# Patient Record
Sex: Female | Born: 1945 | Race: White | Hispanic: No | Smoking: Never smoker
Health system: Southern US, Community
[De-identification: ages and names within clinical notes are randomized; demographics above are authoritative.]

## PROBLEM LIST (undated history)

## (undated) DIAGNOSIS — M1990 Unspecified osteoarthritis, unspecified site: Secondary | ICD-10-CM

## (undated) DIAGNOSIS — E785 Hyperlipidemia, unspecified: Secondary | ICD-10-CM

## (undated) DIAGNOSIS — F329 Major depressive disorder, single episode, unspecified: Secondary | ICD-10-CM

## (undated) DIAGNOSIS — F32A Depression, unspecified: Secondary | ICD-10-CM

## (undated) DIAGNOSIS — D649 Anemia, unspecified: Secondary | ICD-10-CM

## (undated) DIAGNOSIS — E119 Type 2 diabetes mellitus without complications: Secondary | ICD-10-CM

## (undated) DIAGNOSIS — C50919 Malignant neoplasm of unspecified site of unspecified female breast: Secondary | ICD-10-CM

## (undated) DIAGNOSIS — E559 Vitamin D deficiency, unspecified: Secondary | ICD-10-CM

## (undated) DIAGNOSIS — M810 Age-related osteoporosis without current pathological fracture: Secondary | ICD-10-CM

## (undated) DIAGNOSIS — C801 Malignant (primary) neoplasm, unspecified: Secondary | ICD-10-CM

## (undated) DIAGNOSIS — I10 Essential (primary) hypertension: Secondary | ICD-10-CM

## (undated) DIAGNOSIS — R42 Dizziness and giddiness: Secondary | ICD-10-CM

## (undated) HISTORY — PX: COLONOSCOPY: SHX174

## (undated) HISTORY — PX: BREAST SURGERY: SHX581

## (undated) HISTORY — PX: ESOPHAGOGASTRODUODENOSCOPY: SHX1529

---

## 1997-01-28 DIAGNOSIS — C801 Malignant (primary) neoplasm, unspecified: Secondary | ICD-10-CM

## 1997-01-28 HISTORY — PX: MASTECTOMY: SHX3

## 1997-01-28 HISTORY — DX: Malignant (primary) neoplasm, unspecified: C80.1

## 2004-01-10 ENCOUNTER — Ambulatory Visit: Payer: Self-pay | Admitting: Unknown Physician Specialty

## 2004-01-29 HISTORY — PX: BREAST BIOPSY: SHX20

## 2004-07-26 ENCOUNTER — Ambulatory Visit: Payer: Self-pay | Admitting: Family Medicine

## 2004-08-07 ENCOUNTER — Ambulatory Visit: Payer: Self-pay | Admitting: Family Medicine

## 2005-03-04 ENCOUNTER — Ambulatory Visit: Payer: Self-pay | Admitting: General Surgery

## 2006-03-31 ENCOUNTER — Ambulatory Visit: Payer: Self-pay | Admitting: General Surgery

## 2007-04-03 ENCOUNTER — Ambulatory Visit: Payer: Self-pay | Admitting: Family Medicine

## 2008-04-11 ENCOUNTER — Ambulatory Visit: Payer: Self-pay | Admitting: Family Medicine

## 2009-03-24 ENCOUNTER — Ambulatory Visit: Payer: Self-pay | Admitting: Unknown Physician Specialty

## 2009-06-19 ENCOUNTER — Ambulatory Visit: Payer: Self-pay | Admitting: Family Medicine

## 2010-06-26 ENCOUNTER — Ambulatory Visit: Payer: Self-pay | Admitting: Family Medicine

## 2011-07-03 ENCOUNTER — Ambulatory Visit: Payer: Self-pay | Admitting: Family Medicine

## 2011-07-03 IMAGING — MG MM MAMMO DIAGNOSTIC UNILATERAL*L*
1 series · 3 of 3 positions shown · non-contrast
Comparison: none

REASON FOR EXAM: hx brst ca r mast
COMMENTS:

PROCEDURE:     MAM - MAM DGTL UNI MAM LT BREAST W/CAD  - July 03, 2011  [DATE]
RESULT:     COMPARISON:  06/26/2010, 06/19/2009, 07/02/2001
TECHNIQUE: Digital unilateral left mammograms were obtained. FDA approved
computer-aided detection (CAD) for mammography was utilized for this study.

[L CC · left · 3 of 3 slices shown]
[im 1/3]
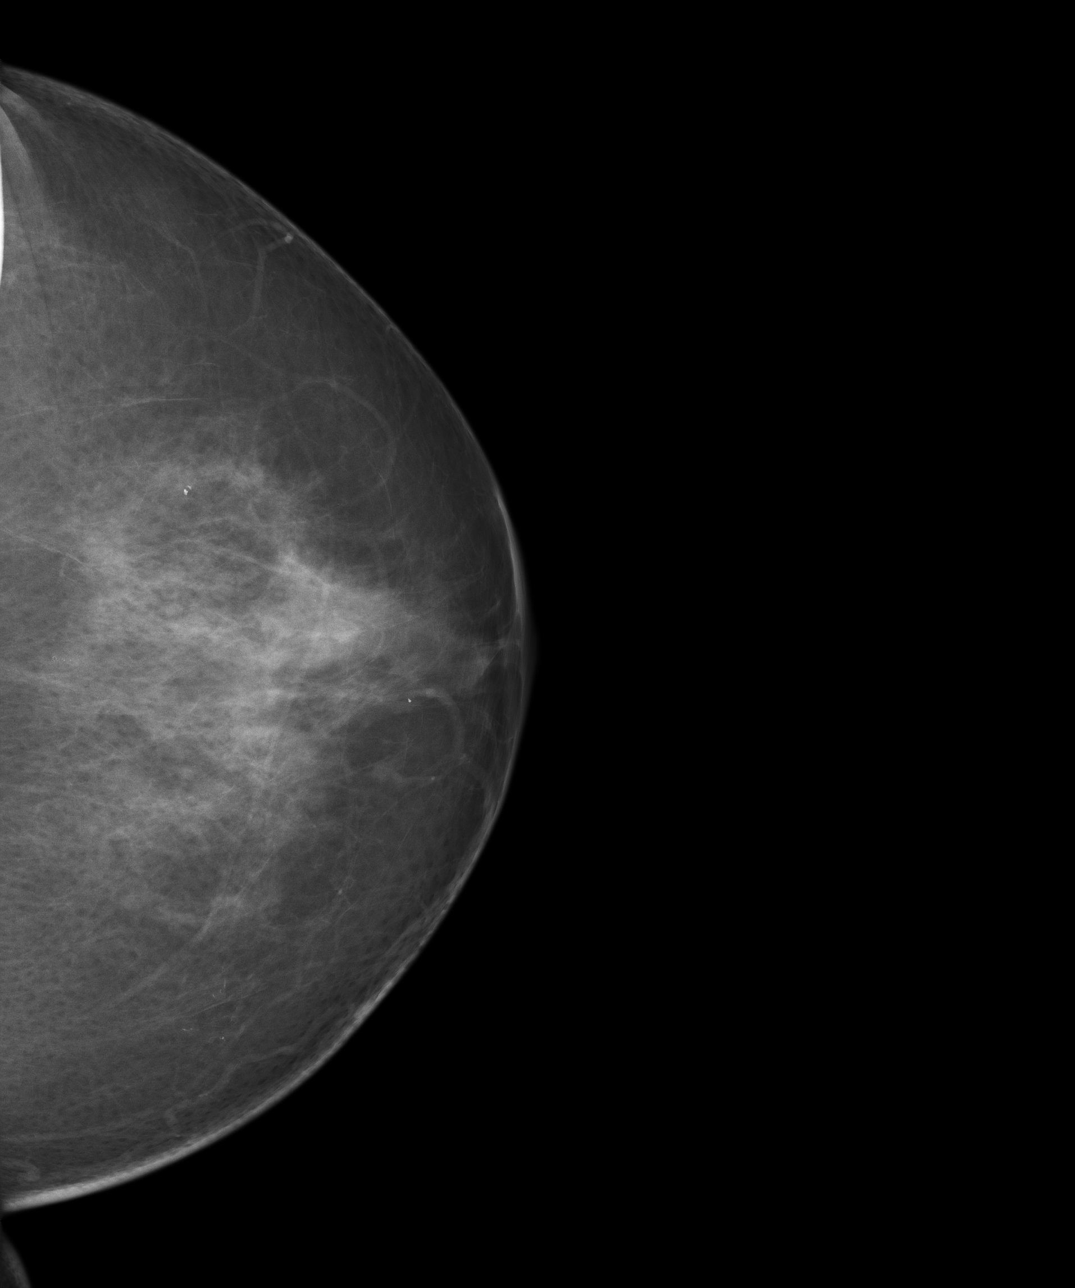
[im 2/3]
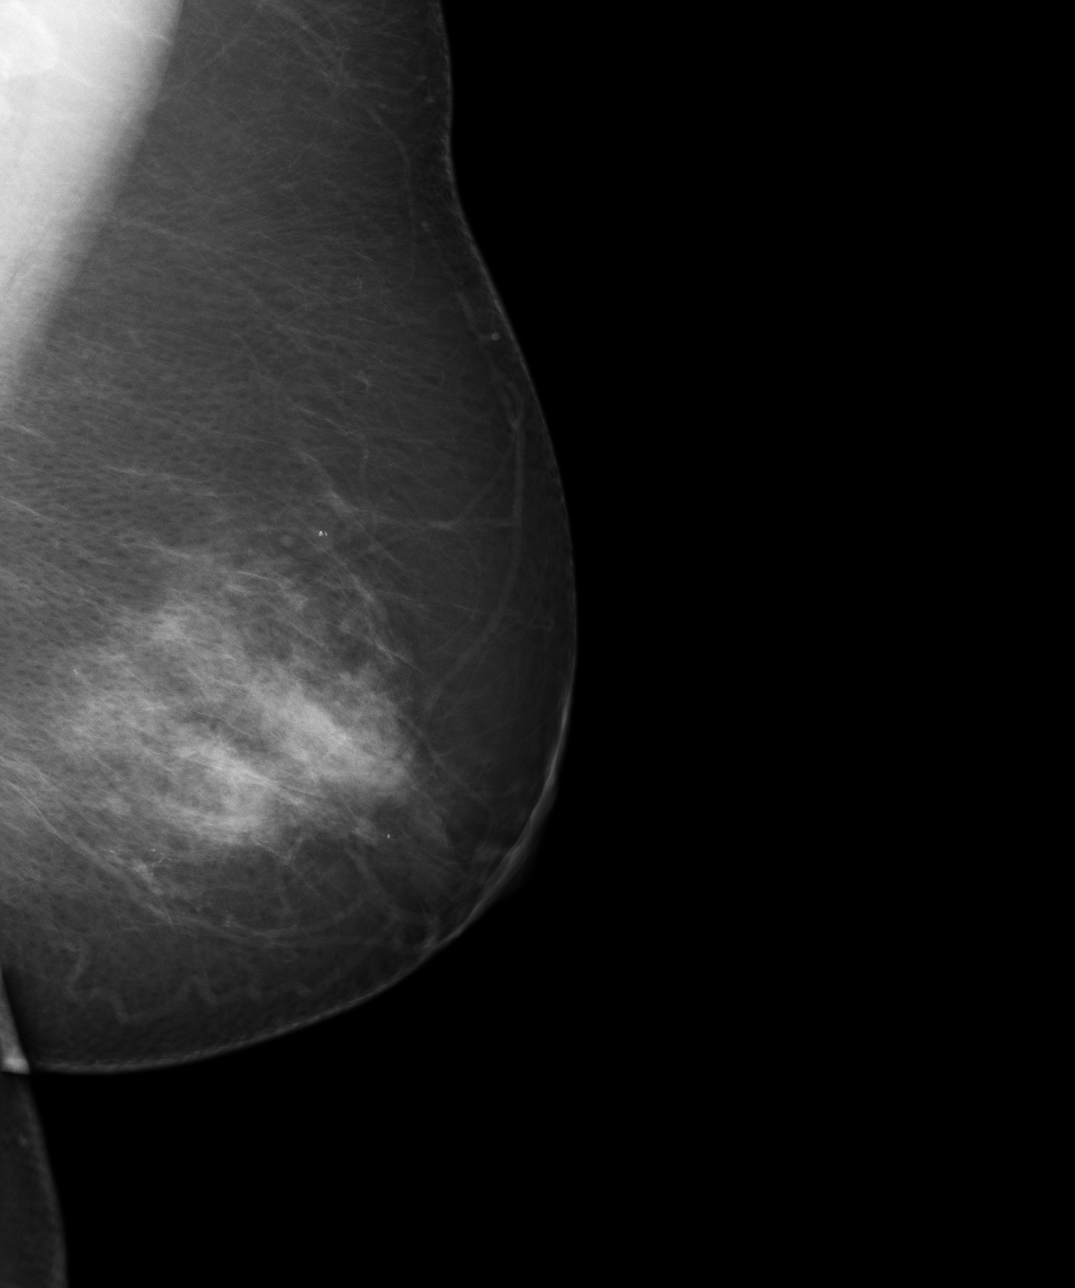
[im 3/3]
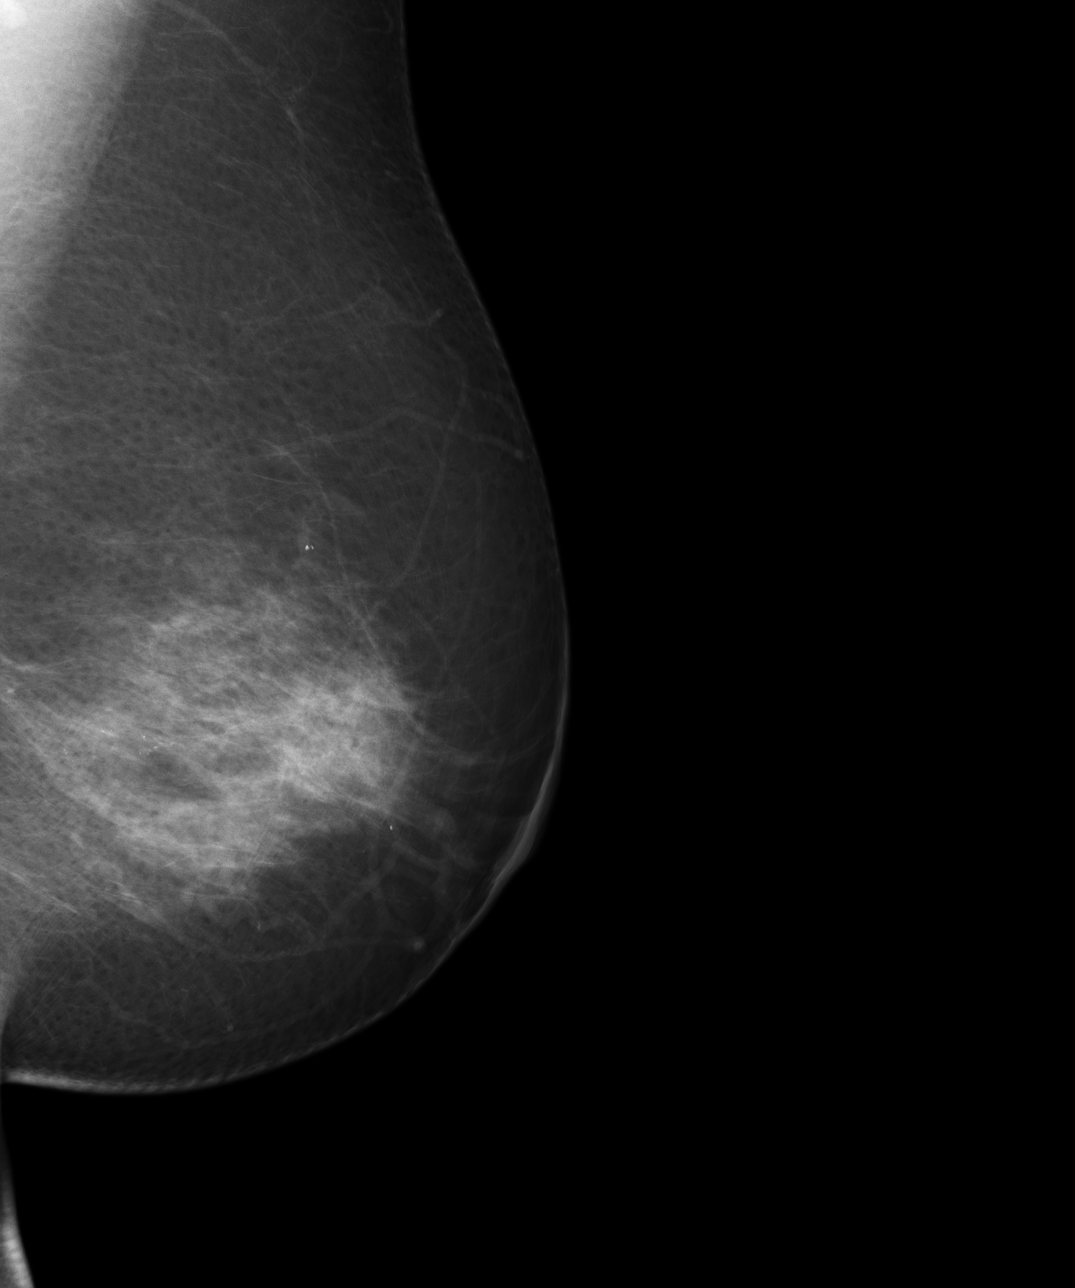

[3 of 3 positions shown; findings below may reference images not displayed]

FINDING: The left breast demonstrates a scattered fibroglandular density. There is no
dominant mass, architectural distortion or clusters of suspicious
microcalcifications.
IMPRESSION: 1.     Stable left breast mammogram.
2.     Annual mammographic follow up recommended.
3.     BI-RADS:  Category 2- Benign.

A negative mammogram report does not preclude biopsy or other evaluation of
a clinically palpable or otherwise suspicious mass or lesion. Breast cancer
may not be detected by mammography in up to 10% of cases.

[REDACTED]

## 2011-11-15 ENCOUNTER — Ambulatory Visit: Payer: Self-pay | Admitting: Internal Medicine

## 2011-11-15 LAB — CBC CANCER CENTER
Basophil %: 0.9 %
Eosinophil %: 3.3 %
HCT: 37.9 % (ref 35.0–47.0)
HGB: 12.3 g/dL (ref 12.0–16.0)
MCH: 30.9 pg (ref 26.0–34.0)
MCHC: 32.4 g/dL (ref 32.0–36.0)
Monocyte #: 0.5 x10 3/mm (ref 0.2–0.9)
Neutrophil %: 68.7 %
RBC: 3.98 10*6/uL (ref 3.80–5.20)

## 2011-11-15 LAB — FERRITIN: Ferritin (ARMC): 65 ng/mL (ref 8–388)

## 2011-11-15 LAB — IRON AND TIBC
Iron Bind.Cap.(Total): 396 ug/dL (ref 250–450)
Iron Saturation: 19 %
Iron: 76 ug/dL (ref 50–170)

## 2011-11-15 LAB — RETICULOCYTES: Absolute Retic Count: 0.0527 10*6/uL (ref 0.023–0.096)

## 2011-11-28 LAB — CANCER CTR PLATELET CT: Platelet: 122 x10 3/mm — ABNORMAL LOW (ref 150–440)

## 2011-11-29 ENCOUNTER — Ambulatory Visit: Payer: Self-pay | Admitting: Internal Medicine

## 2011-12-02 ENCOUNTER — Ambulatory Visit: Payer: Self-pay | Admitting: Internal Medicine

## 2011-12-02 IMAGING — US ABDOMEN ULTRASOUND LIMITED
1 series · 14 of 25 positions shown · non-contrast
Comparison: none

REASON FOR EXAM: Thrombocytopenia
COMMENTS:

[Series 1: abdomen ultrasound limited · 0.31mm/px · 14 of 87 slices shown]
[im 1/87]
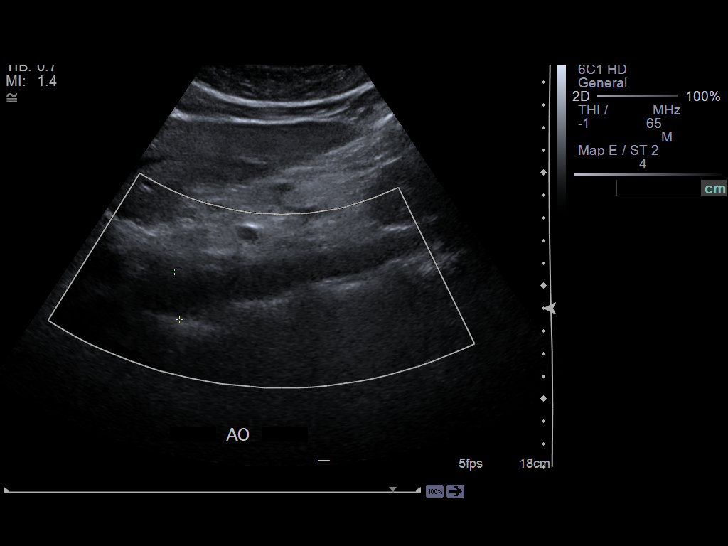
[im 8/87]
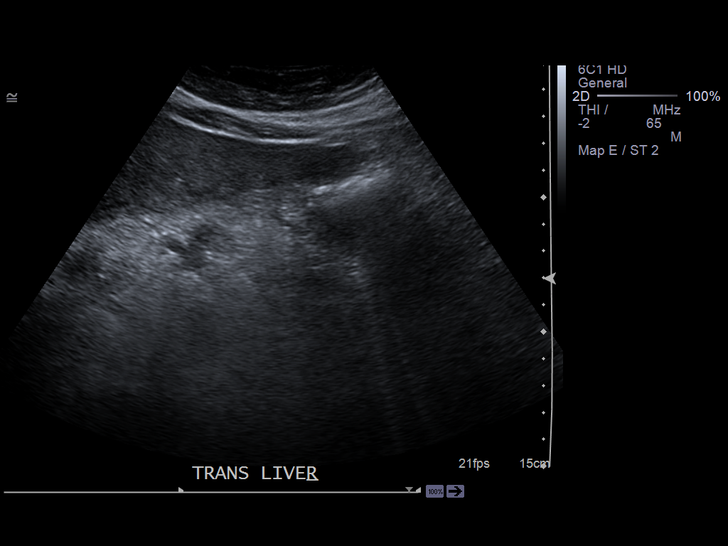
[im 15/87]
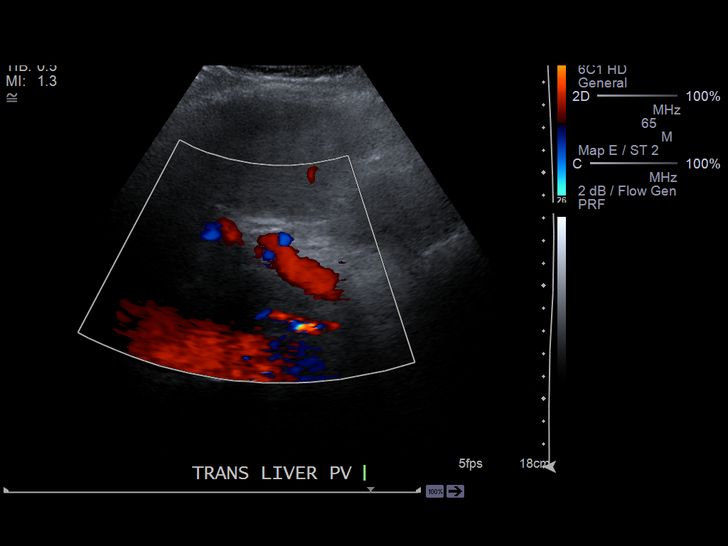
[im 22/87]
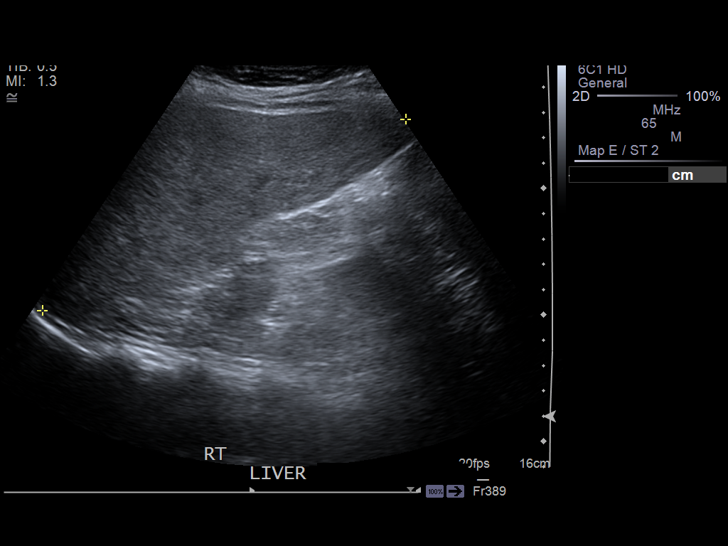
[im 29/87]
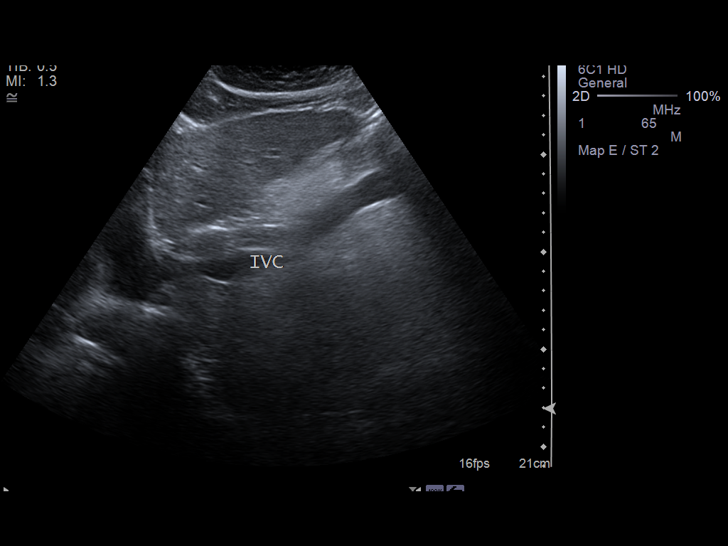
[im 33/87]
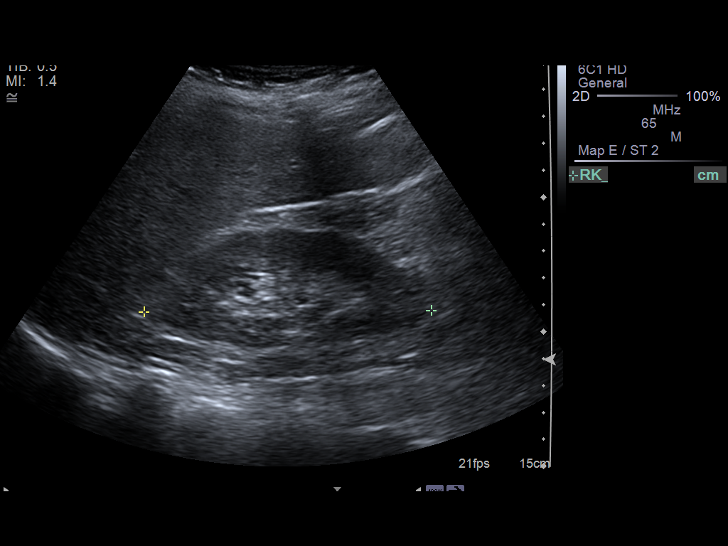
[im 40/87]
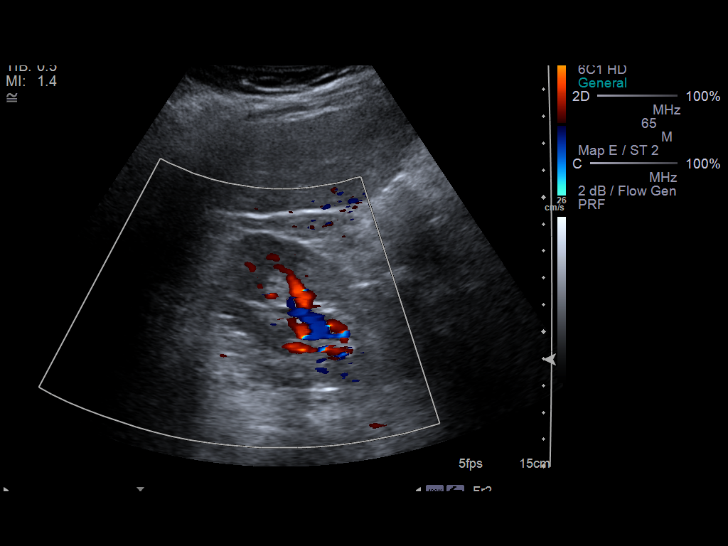
[im 47/87]
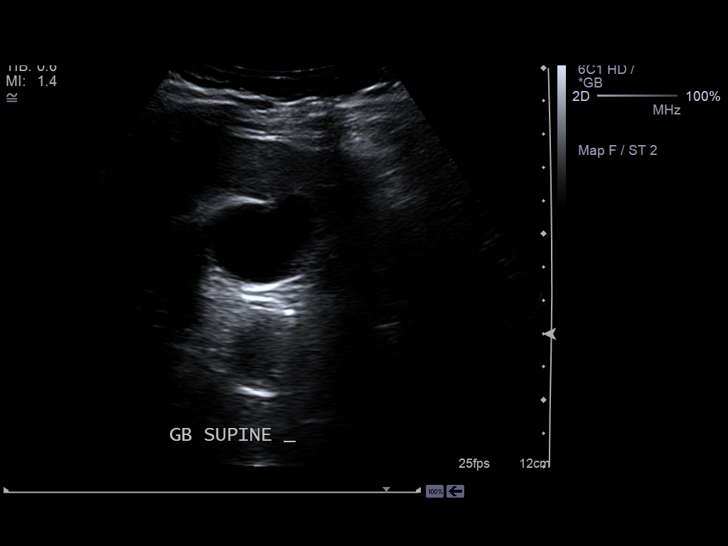
[im 54/87]
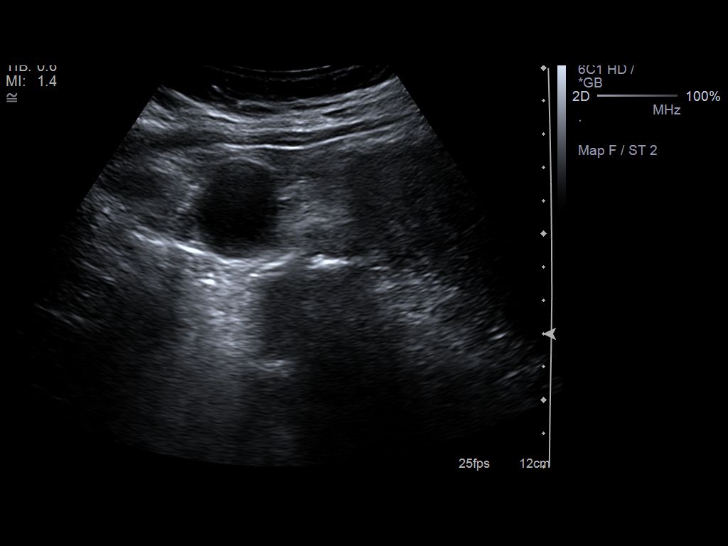
[im 58/87]
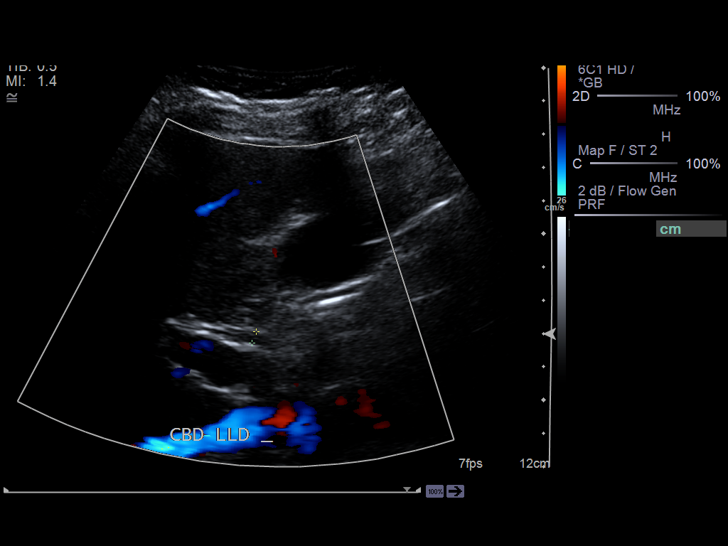
[im 65/87]
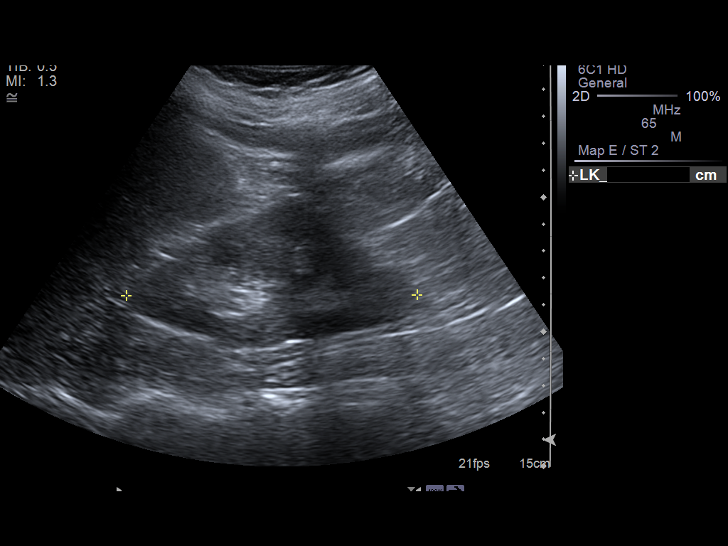
[im 72/87]
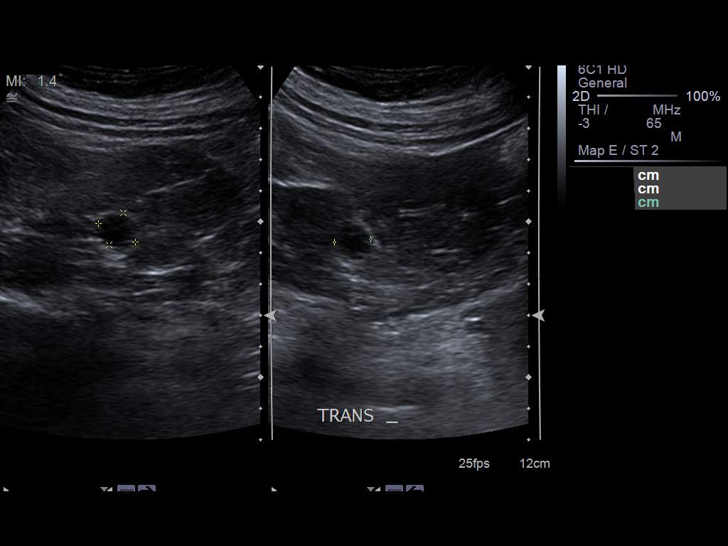
[im 79/87]
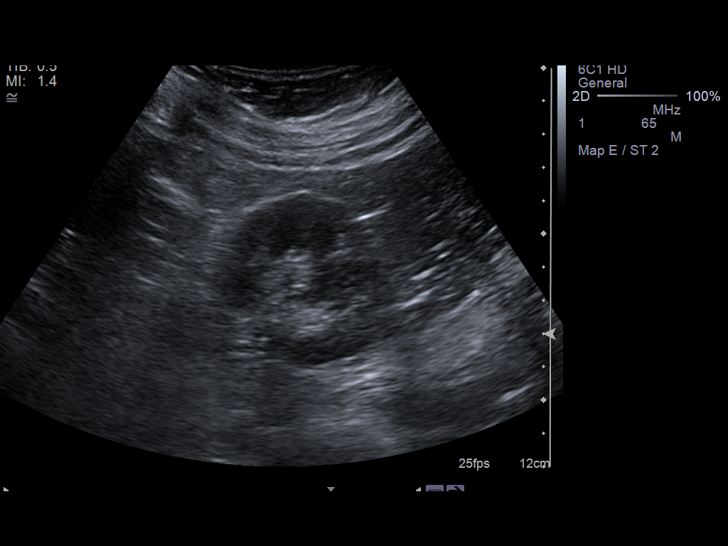
[im 87/87]
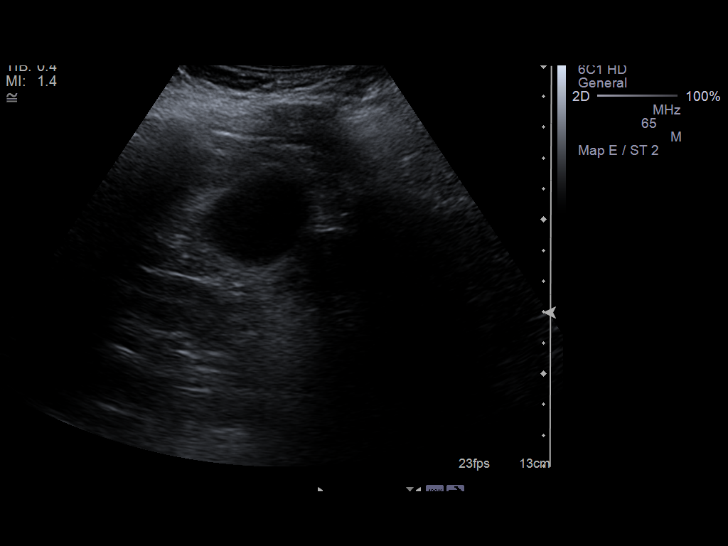

[14 of 25 positions shown; findings below may reference images not displayed]

PROCEDURE:     NAE - NAE ABDOMEN UPPER GENERAL  - December 02, 2011  [DATE]

RESULT:

The liver demonstrates a homogeneous echotexture. Hepatopetal flow is
identified within the portal vein. The aorta and IVC are unremarkable.
Limited evaluation of the pancreas is unremarkable. The spleen demonstrates
a homogeneous echotexture and measures 13.73 cm in longitudinal dimensions.

The right kidney measures 10.67 x 4.67 x 4.96 cm and the left 10.81 x 4.47 x
4.57 cm. There is appropriate corticomedullary differentiation without
evidence of hydronephrosis, solid masses nor calculi. A small cyst is
identified within the left kidney measuring 1.33 x 1.12 x 1.17 cm.
IMPRESSION: 1. Left renal cyst.
2. Spleen upper limits of normal.
3. No further focal or acute abnormalities.

## 2011-12-05 LAB — OCCULT BLOOD X 1 CARD TO LAB, STOOL
Occult Blood, Feces: NEGATIVE
Occult Blood, Feces: NEGATIVE

## 2011-12-05 LAB — IRON AND TIBC
Iron Bind.Cap.(Total): 329 ug/dL (ref 250–450)
Iron: 86 ug/dL (ref 50–170)

## 2011-12-29 ENCOUNTER — Ambulatory Visit: Payer: Self-pay | Admitting: Internal Medicine

## 2012-01-02 LAB — CBC CANCER CENTER
Basophil %: 0.8 %
Eosinophil %: 2.5 %
HCT: 33.3 % — ABNORMAL LOW (ref 35.0–47.0)
HGB: 11.5 g/dL — ABNORMAL LOW (ref 12.0–16.0)
Lymphocyte %: 18.2 %
MCH: 31.8 pg (ref 26.0–34.0)
Neutrophil %: 70 %
Platelet: 106 x10 3/mm — ABNORMAL LOW (ref 150–440)
RBC: 3.61 10*6/uL — ABNORMAL LOW (ref 3.80–5.20)
WBC: 5.5 x10 3/mm (ref 3.6–11.0)

## 2012-01-29 ENCOUNTER — Ambulatory Visit: Payer: Self-pay | Admitting: Internal Medicine

## 2012-02-13 LAB — OCCULT BLOOD X 1 CARD TO LAB, STOOL
Occult Blood, Feces: NEGATIVE
Occult Blood, Feces: NEGATIVE

## 2012-02-13 LAB — CBC CANCER CENTER
Basophil #: 0.1 x10 3/mm (ref 0.0–0.1)
HCT: 36 % (ref 35.0–47.0)
HGB: 12 g/dL (ref 12.0–16.0)
Lymphocyte %: 17.9 %
MCH: 31 pg (ref 26.0–34.0)
MCHC: 33.4 g/dL (ref 32.0–36.0)
MCV: 93 fL (ref 80–100)
Monocyte %: 10 %
Neutrophil #: 3.9 x10 3/mm (ref 1.4–6.5)
Neutrophil %: 68.6 %
Platelet: 116 x10 3/mm — ABNORMAL LOW (ref 150–440)
RBC: 3.89 10*6/uL (ref 3.80–5.20)

## 2012-02-29 ENCOUNTER — Ambulatory Visit: Payer: Self-pay | Admitting: Internal Medicine

## 2012-04-28 ENCOUNTER — Ambulatory Visit: Payer: Self-pay | Admitting: Internal Medicine

## 2012-05-14 LAB — CBC CANCER CENTER
Basophil #: 0.1 x10 3/mm (ref 0.0–0.1)
Basophil %: 1.4 %
Eosinophil #: 0.2 x10 3/mm (ref 0.0–0.7)
HGB: 12.3 g/dL (ref 12.0–16.0)
Lymphocyte %: 16.9 %
MCH: 31.3 pg (ref 26.0–34.0)
MCV: 94 fL (ref 80–100)
Monocyte #: 0.4 x10 3/mm (ref 0.2–0.9)
Monocyte %: 7.7 %
Neutrophil %: 69.5 %
Platelet: 107 x10 3/mm — ABNORMAL LOW (ref 150–440)
RBC: 3.94 10*6/uL (ref 3.80–5.20)
WBC: 5.5 x10 3/mm (ref 3.6–11.0)

## 2012-05-14 LAB — IRON AND TIBC: Iron: 87 ug/dL (ref 50–170)

## 2012-05-14 LAB — FERRITIN: Ferritin (ARMC): 69 ng/mL (ref 8–388)

## 2012-05-14 LAB — OCCULT BLOOD X 1 CARD TO LAB, STOOL
Occult Blood, Feces: NEGATIVE
Occult Blood, Feces: NEGATIVE

## 2012-05-28 ENCOUNTER — Ambulatory Visit: Payer: Self-pay | Admitting: Internal Medicine

## 2012-07-07 ENCOUNTER — Ambulatory Visit: Payer: Self-pay | Admitting: Family Medicine

## 2012-07-07 IMAGING — MG MM MAMMO DIAGNOSTIC UNILATERAL*L*
3 series · 5 of 5 positions shown · non-contrast
Comparison: none

REASON FOR EXAM: HX BRST CA RT MAST
COMMENTS:

[L CC · left · 3 of 3 slices shown]
[im 1/3]
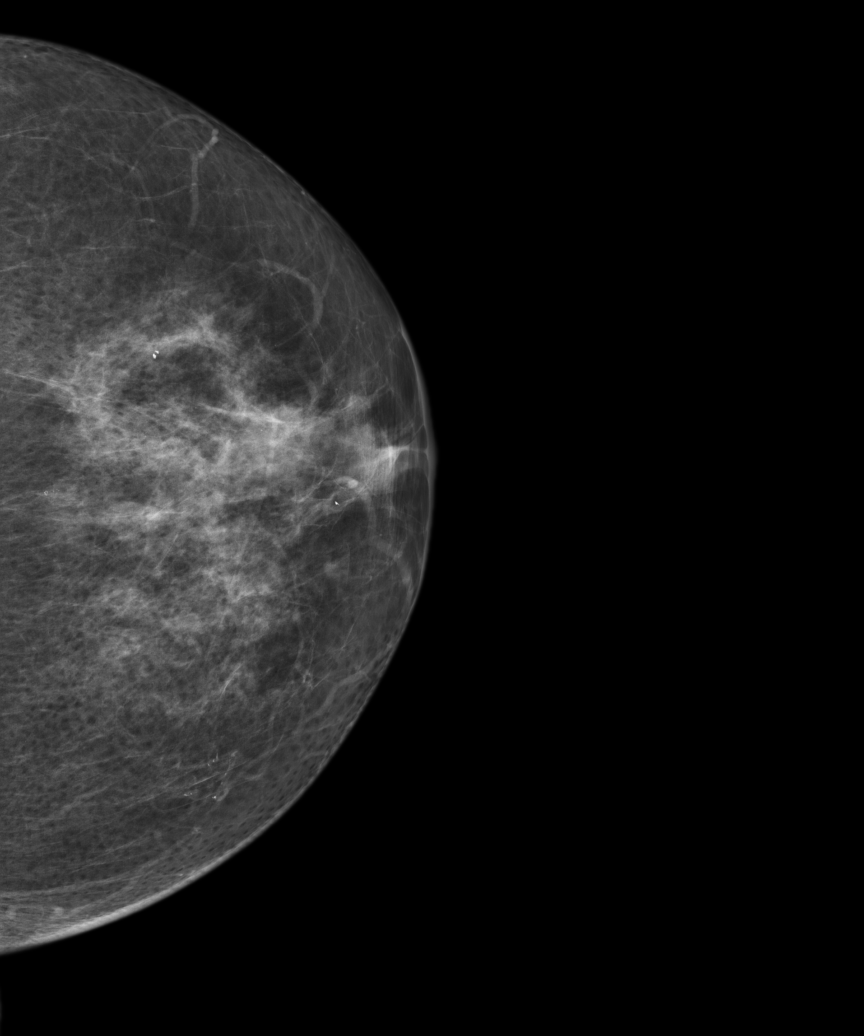
[im 2/3]
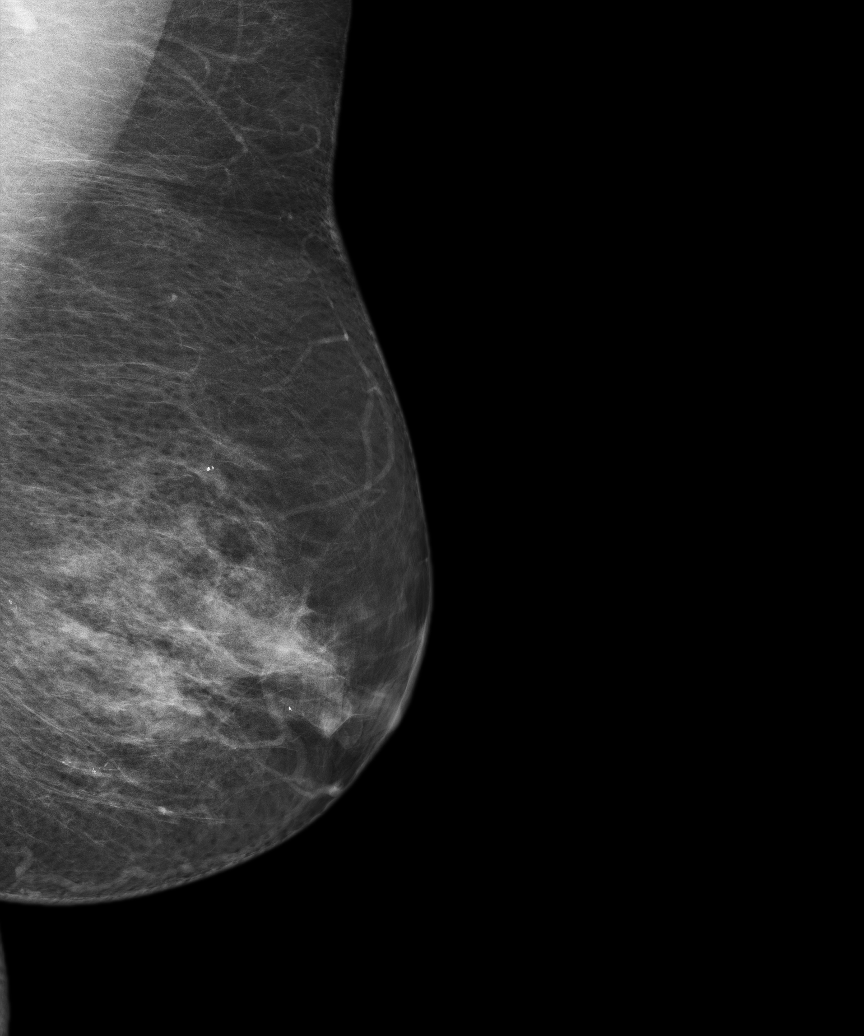
[im 3/3]
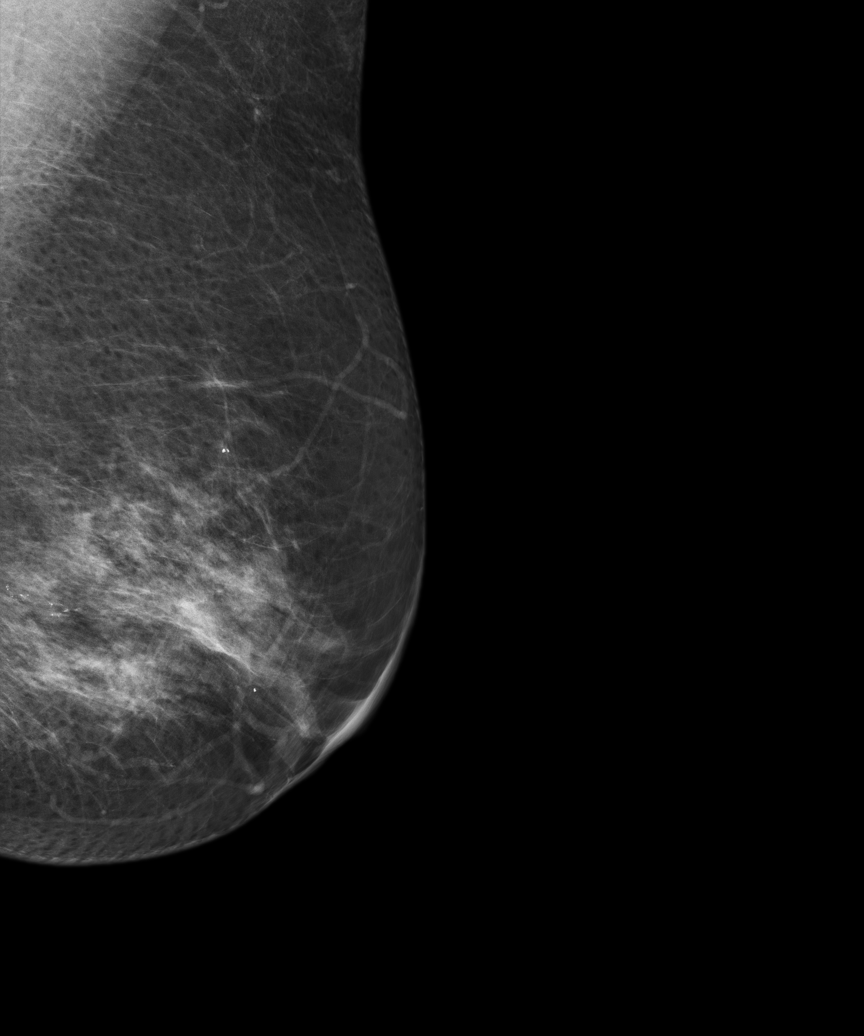

[L MLO]
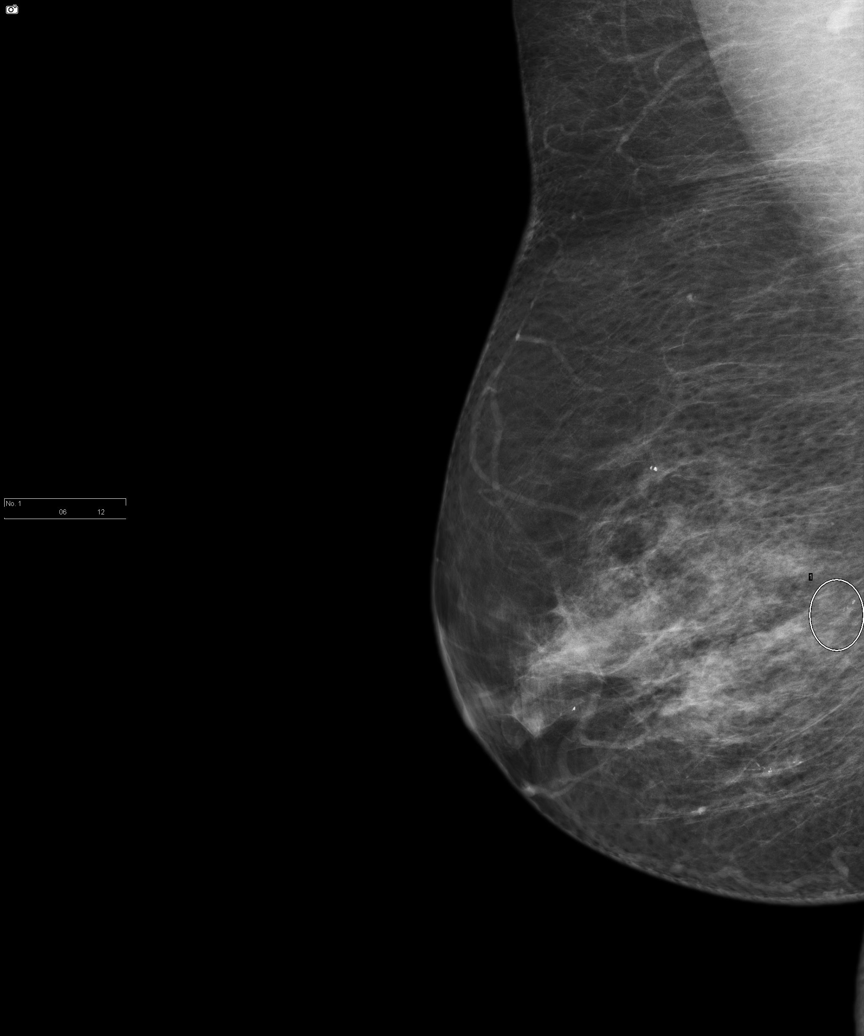

[L ML]
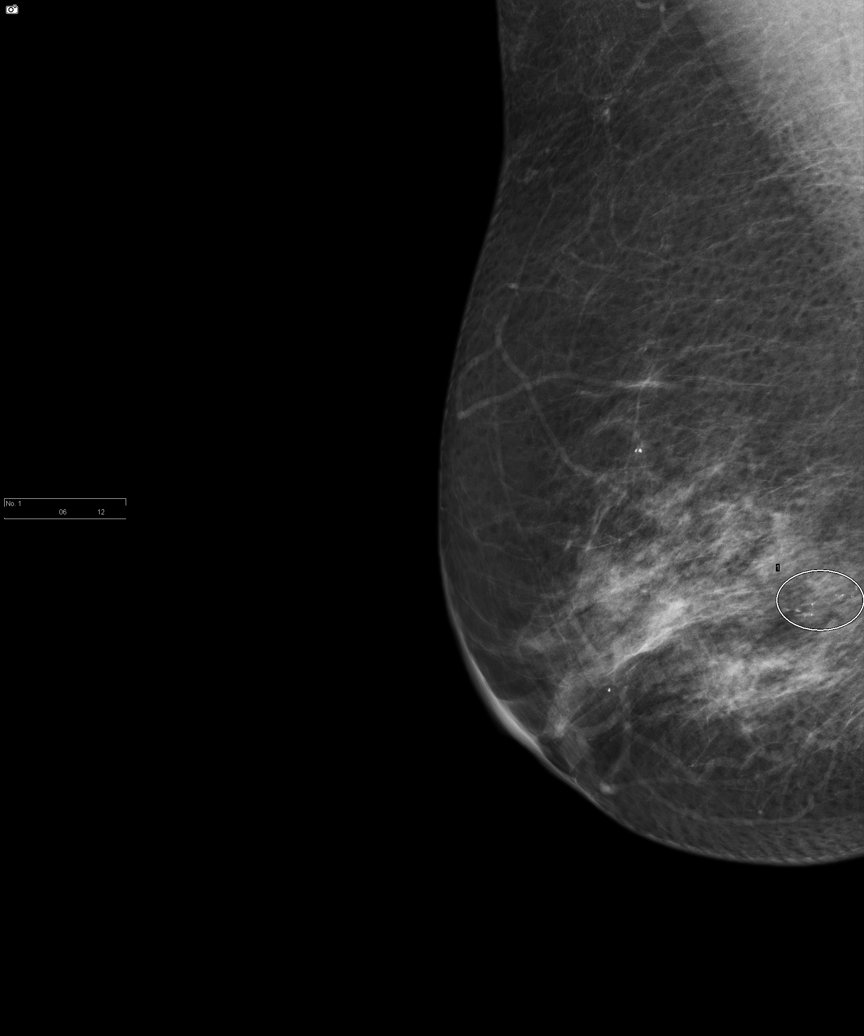

[5 of 5 positions shown; findings below may reference images not displayed]

PROCEDURE:     MAM - MAM DGTL UNI MAM LT BREAST W/CAD  - July 07, 2012 [DATE]

RESULT:     The patient has history of previous right mastectomy. Today's
mammogram of the left breast is compared to previous studies July 13, 2011,June 26, 2010, and April 03, 2007.

The left breast exhibits a heterogeneously dense parenchymal pattern with
evidence of some ongoing involution. There are punctate rounded coarse
microcalcifications bilaterally. There is no dominant mass. There are coarse
microcalcifications which have become more conspicuous in the deep
parenchyma of the left breast. These lie just below the equator of the
breast on the true lateral and MLO views. There are not clearly included in
the field-of-view of the CC view today. They may lies centrally. There are
other loosely grouped stable microcalcifications quite medially in the
midportion of the left breast inferiorly which had been seen at least as far
back as [id number].
IMPRESSION: There are indeterminate microcalcifications demonstrated
today on the MLO and true lateral films which are not clearly completely
included on the cc view but may lie centrally on the cc view. Medially and
laterally rotated cc views with spot compression are needed to evaluate this
region more completely.

BI-RADS 0: Additional workup of the left breast with supplemental
mammographic views is recommended.

The area in question has been marked electronically and the images saved.

BREAST COMPOSITION: The breast composition is HETEROGENEOUSLY DENSE
(glandular tissue is 51-75%) This may decrease the sensitivity of
mammography.

A NEGATIVE MAMMOGRAM REPORT DOES NOT PRECLUDE BIOPSY OR OTHER EVALUATION OF
A CLINICALLY PALPABLE OR OTHERWISE SUSPICIOUS MASS OR LESION. BREAST CANCER
MAY NOT BE DETECTED BY MAMMOGRAPHY IN UP TO 10% OF CASES.

[REDACTED]

## 2012-07-08 ENCOUNTER — Ambulatory Visit: Payer: Self-pay | Admitting: Family Medicine

## 2012-07-08 IMAGING — MG MM ADDITIONAL VIEWS AT NO CHARGE
1 series · 7 of 7 positions shown · non-contrast
Comparison: none

REASON FOR EXAM: av lt microcalcifications
COMMENTS:

[L XCCL · left · 7 of 7 slices shown]
[im 1/7]
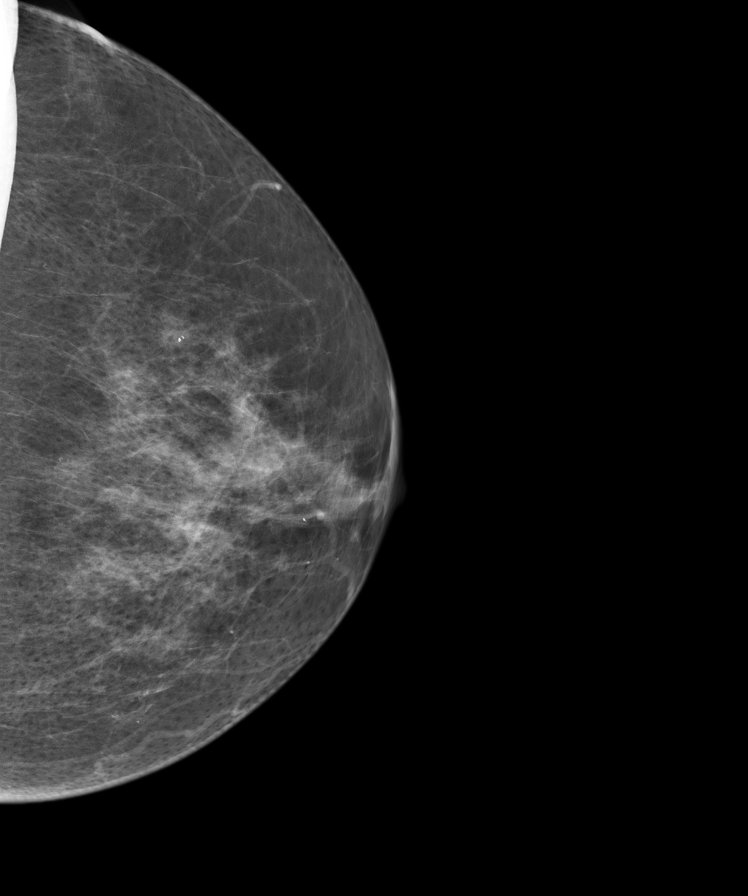
[im 2/7]
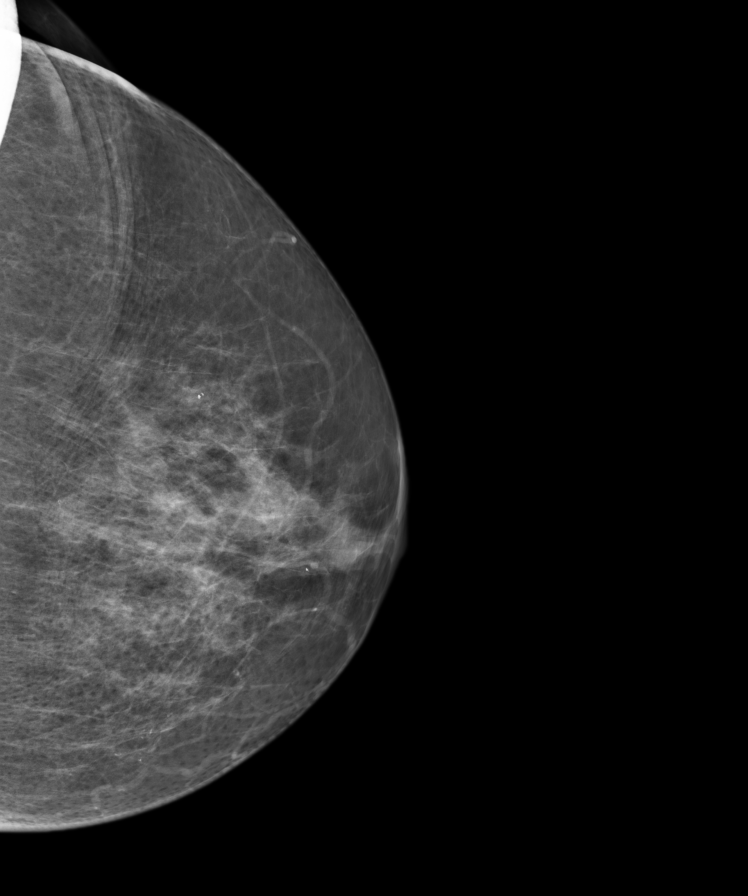
[im 3/7]
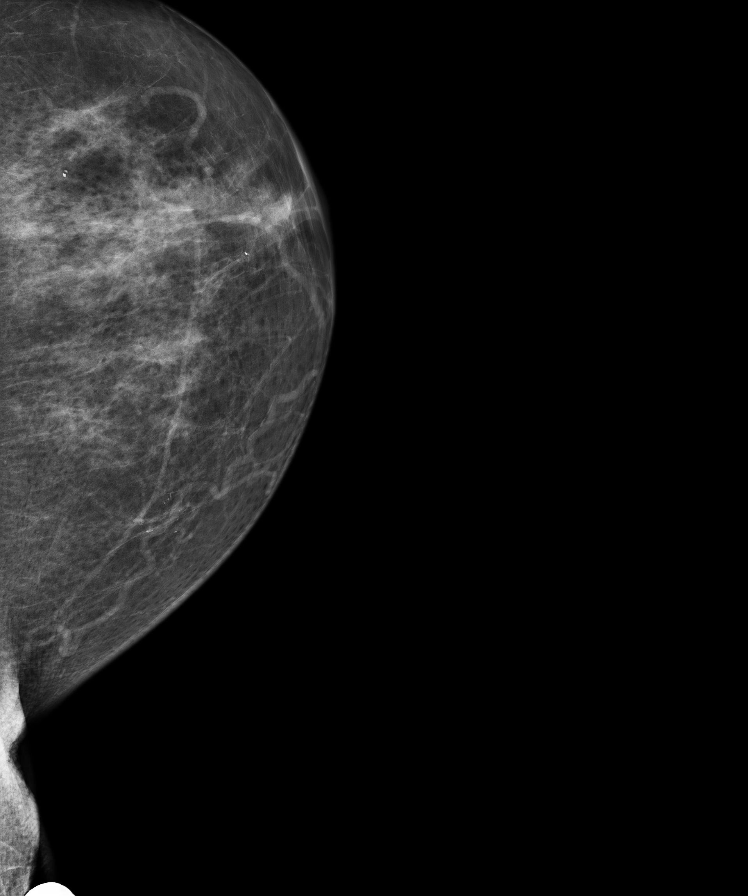
[im 4/7]
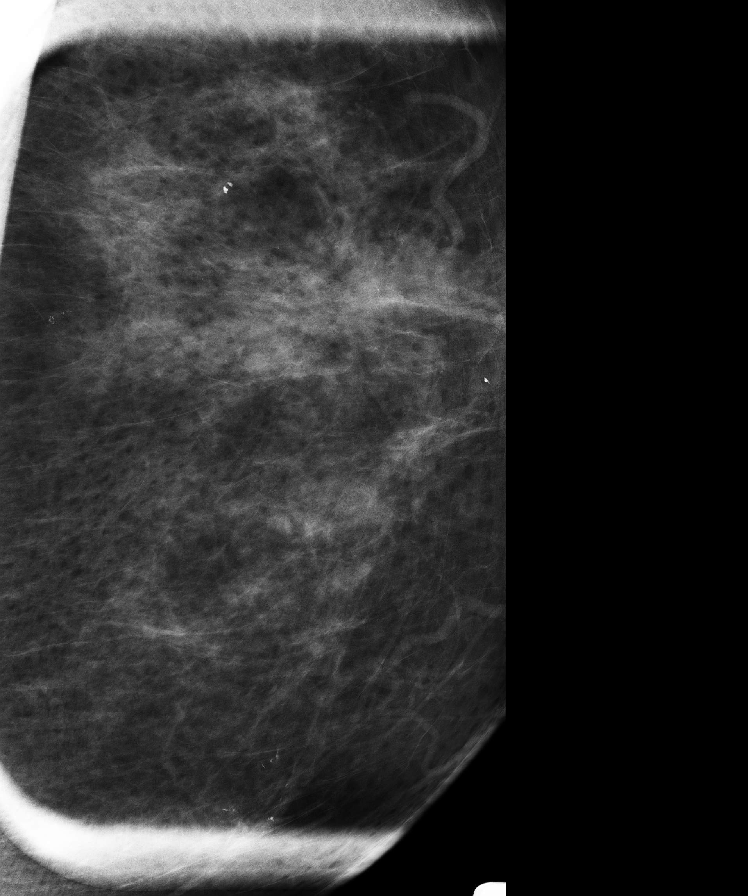
[im 5/7]
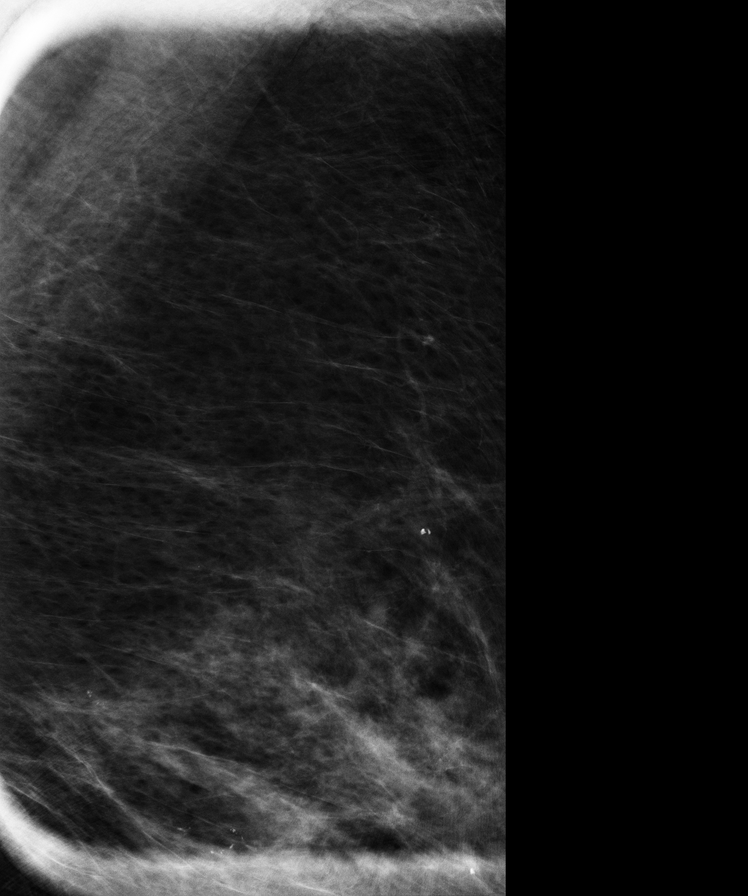
[im 6/7]
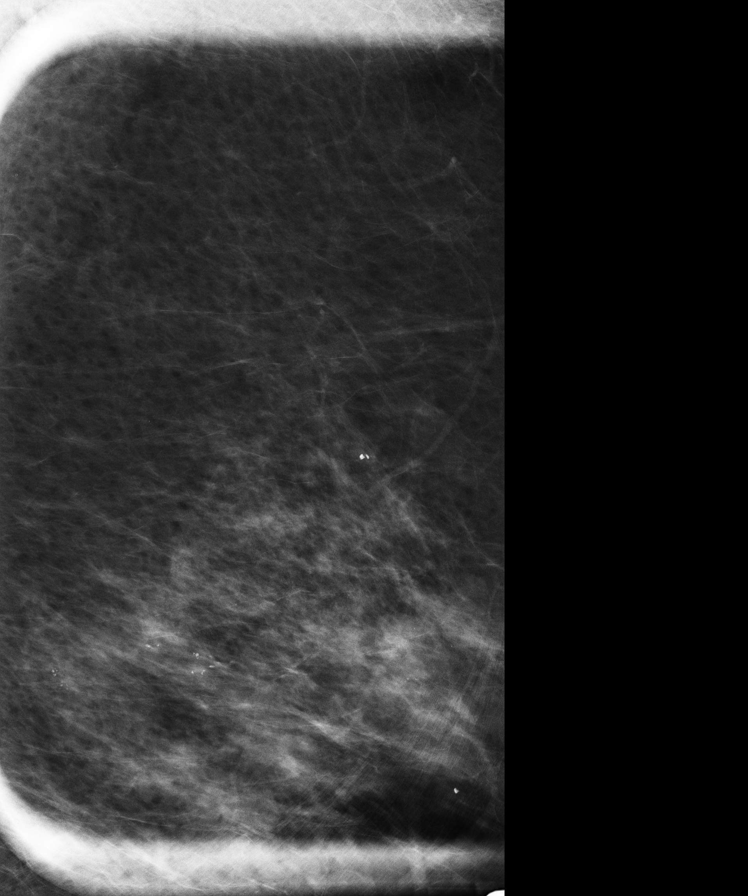
[im 7/7]
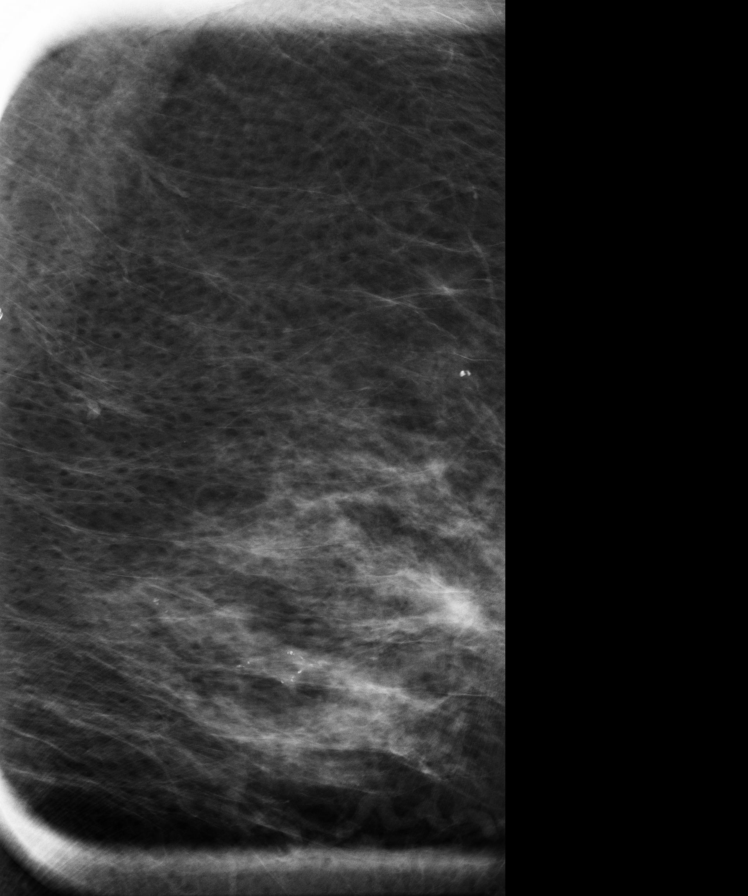

[7 of 7 positions shown; findings below may reference images not displayed]

PROCEDURE:     MAM - MAM DGTL [HOSPITAL] ADD VIEWS LT DIAG  - July 08, 2012 [DATE]

RESULT:     On 07 July, 2012, the patient underwent screening mammography of
the left breast. The patient has history of previous right mastectomy. At
the time of the recent study the patient was noted to have indeterminate
microcalcifications not clearly evident on the cc view but present on the
MLO and true lateral film. The patient was asked to return for supplemental
imaging of the left breast.

On these supplemental CC images there are coarse microcalcifications which
lie medially which correspond to inferiorly positioned microcalcifications
on the MLO views. There is a faint deeper grouping of microcalcifications
lying near the chest wall in the central portion of the left breast at the
junction of the dense fibroglandular tissue fatty tissue on the cc view. The
also lie relatively centrally on the MLO view. These do not appear
suspicious in their morphology.
IMPRESSION: There are 2 groupings of microcalcifications on the left.
One lies infero- medially in the midportion of the breast. The second it is
a smaller cluster that is located in the deep central aspect of the breast
just below the equator. These do not appear new when compared to previous
studies.

BI-RADS 2: Benign findings.

Recommendation: Please continue to encourage yearly mammographic followup.

BREAST COMPOSITION: The breast composition is SCATTERED FIBROGLANDULAR
TISSUE (glandular tissue is 25-50%)

A NEGATIVE MAMMOGRAM REPORT DOES NOT PRECLUDE BIOPSY OR OTHER EVALUATION OF
A CLINICALLY PALPABLE OR OTHERWISE SUSPICIOUS MASS OR LESION. BREAST CA[HOSPITAL]ER
MAY NOT BE DETECTED BY MAMMOGRAPHY IN UP TO 10% OF CASES.

[REDACTED]

## 2012-07-28 ENCOUNTER — Ambulatory Visit: Payer: Self-pay | Admitting: Internal Medicine

## 2012-08-24 LAB — CBC CANCER CENTER
Eosinophil %: 3.3 %
Lymphocyte #: 1 x10 3/mm (ref 1.0–3.6)
MCHC: 34.7 g/dL (ref 32.0–36.0)
Monocyte #: 0.5 x10 3/mm (ref 0.2–0.9)
Monocyte %: 9.2 %
Neutrophil #: 3.6 x10 3/mm (ref 1.4–6.5)
Platelet: 111 x10 3/mm — ABNORMAL LOW (ref 150–440)
RBC: 3.59 10*6/uL — ABNORMAL LOW (ref 3.80–5.20)
RDW: 13.4 % (ref 11.5–14.5)
WBC: 5.3 x10 3/mm (ref 3.6–11.0)

## 2012-08-28 ENCOUNTER — Ambulatory Visit: Payer: Self-pay | Admitting: Internal Medicine

## 2013-02-19 ENCOUNTER — Ambulatory Visit: Payer: Self-pay | Admitting: Internal Medicine

## 2013-02-22 LAB — CBC CANCER CENTER
BASOS PCT: 1.3 %
Basophil #: 0.1 x10 3/mm (ref 0.0–0.1)
Eosinophil #: 0.2 x10 3/mm (ref 0.0–0.7)
Eosinophil %: 2.9 %
HCT: 35.1 % (ref 35.0–47.0)
HGB: 11.6 g/dL — ABNORMAL LOW (ref 12.0–16.0)
Lymphocyte #: 1 x10 3/mm (ref 1.0–3.6)
Lymphocyte %: 18.4 %
MCH: 31.2 pg (ref 26.0–34.0)
MCHC: 32.9 g/dL (ref 32.0–36.0)
MCV: 95 fL (ref 80–100)
MONOS PCT: 9.9 %
Monocyte #: 0.5 x10 3/mm (ref 0.2–0.9)
Neutrophil #: 3.6 x10 3/mm (ref 1.4–6.5)
Neutrophil %: 67.5 %
Platelet: 95 x10 3/mm — ABNORMAL LOW (ref 150–440)
RBC: 3.71 10*6/uL — ABNORMAL LOW (ref 3.80–5.20)
RDW: 13.1 % (ref 11.5–14.5)
WBC: 5.3 x10 3/mm (ref 3.6–11.0)

## 2013-02-28 ENCOUNTER — Ambulatory Visit: Payer: Self-pay | Admitting: Internal Medicine

## 2013-03-29 ENCOUNTER — Ambulatory Visit: Payer: Self-pay | Admitting: Internal Medicine

## 2013-03-29 LAB — CBC CANCER CENTER
BASOS ABS: 0.1 x10 3/mm (ref 0.0–0.1)
Basophil %: 1 %
EOS ABS: 0.1 x10 3/mm (ref 0.0–0.7)
Eosinophil %: 3 %
HCT: 35.1 % (ref 35.0–47.0)
HGB: 11.5 g/dL — AB (ref 12.0–16.0)
Lymphocyte #: 0.8 x10 3/mm — ABNORMAL LOW (ref 1.0–3.6)
Lymphocyte %: 17.1 %
MCH: 31.1 pg (ref 26.0–34.0)
MCHC: 32.9 g/dL (ref 32.0–36.0)
MCV: 95 fL (ref 80–100)
Monocyte #: 0.4 x10 3/mm (ref 0.2–0.9)
Monocyte %: 7.9 %
NEUTROS ABS: 3.5 x10 3/mm (ref 1.4–6.5)
NEUTROS PCT: 71 %
Platelet: 104 x10 3/mm — ABNORMAL LOW (ref 150–440)
RBC: 3.71 10*6/uL — ABNORMAL LOW (ref 3.80–5.20)
RDW: 13.4 % (ref 11.5–14.5)
WBC: 4.9 x10 3/mm (ref 3.6–11.0)

## 2013-04-28 ENCOUNTER — Ambulatory Visit: Payer: Self-pay | Admitting: Internal Medicine

## 2013-06-28 ENCOUNTER — Ambulatory Visit: Payer: Self-pay | Admitting: Internal Medicine

## 2013-06-28 LAB — CBC CANCER CENTER
BASOS PCT: 2.4 %
Basophil #: 0.1 x10 3/mm (ref 0.0–0.1)
Eosinophil #: 0.5 x10 3/mm (ref 0.0–0.7)
Eosinophil %: 8.9 %
HCT: 34.7 % — ABNORMAL LOW (ref 35.0–47.0)
HGB: 11.7 g/dL — AB (ref 12.0–16.0)
LYMPHS PCT: 16.5 %
Lymphocyte #: 1 x10 3/mm (ref 1.0–3.6)
MCH: 32.1 pg (ref 26.0–34.0)
MCHC: 33.7 g/dL (ref 32.0–36.0)
MCV: 95 fL (ref 80–100)
Monocyte #: 0.5 x10 3/mm (ref 0.2–0.9)
Monocyte %: 9 %
NEUTROS ABS: 3.8 x10 3/mm (ref 1.4–6.5)
NEUTROS PCT: 63.2 %
Platelet: 111 x10 3/mm — ABNORMAL LOW (ref 150–440)
RBC: 3.65 10*6/uL — AB (ref 3.80–5.20)
RDW: 13.5 % (ref 11.5–14.5)
WBC: 6 x10 3/mm (ref 3.6–11.0)

## 2013-07-28 ENCOUNTER — Ambulatory Visit: Payer: Self-pay | Admitting: Internal Medicine

## 2013-08-06 ENCOUNTER — Ambulatory Visit: Payer: Self-pay | Admitting: Family Medicine

## 2013-08-06 IMAGING — MG MM MAMMO SCREENING UNILAT*L*
1 series · 3 of 3 positions shown · non-contrast
Comparison: Previous exam(s).

CLINICAL DATA: Screening.

EXAM:
DIGITAL SCREENING UNILATERAL LEFT MAMMOGRAM

[L CC · left · 3 of 3 slices shown]
[im 1/3]
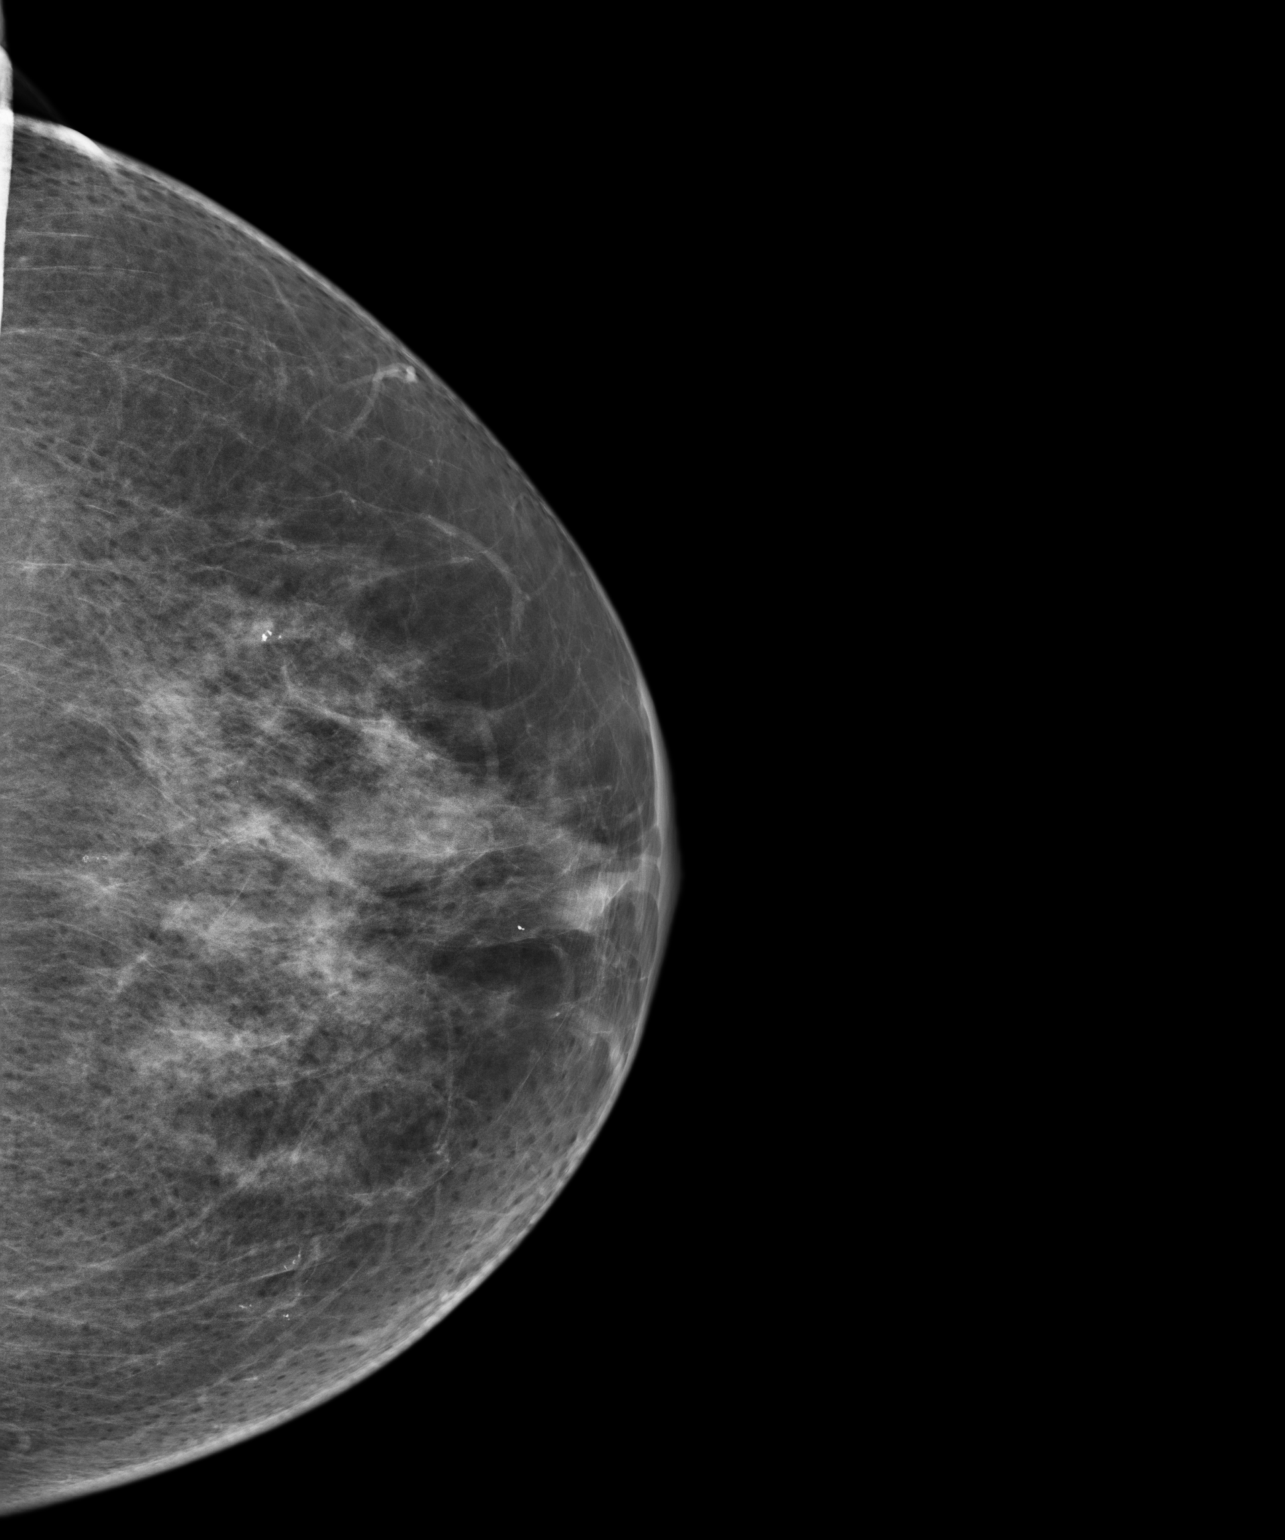
[im 2/3]
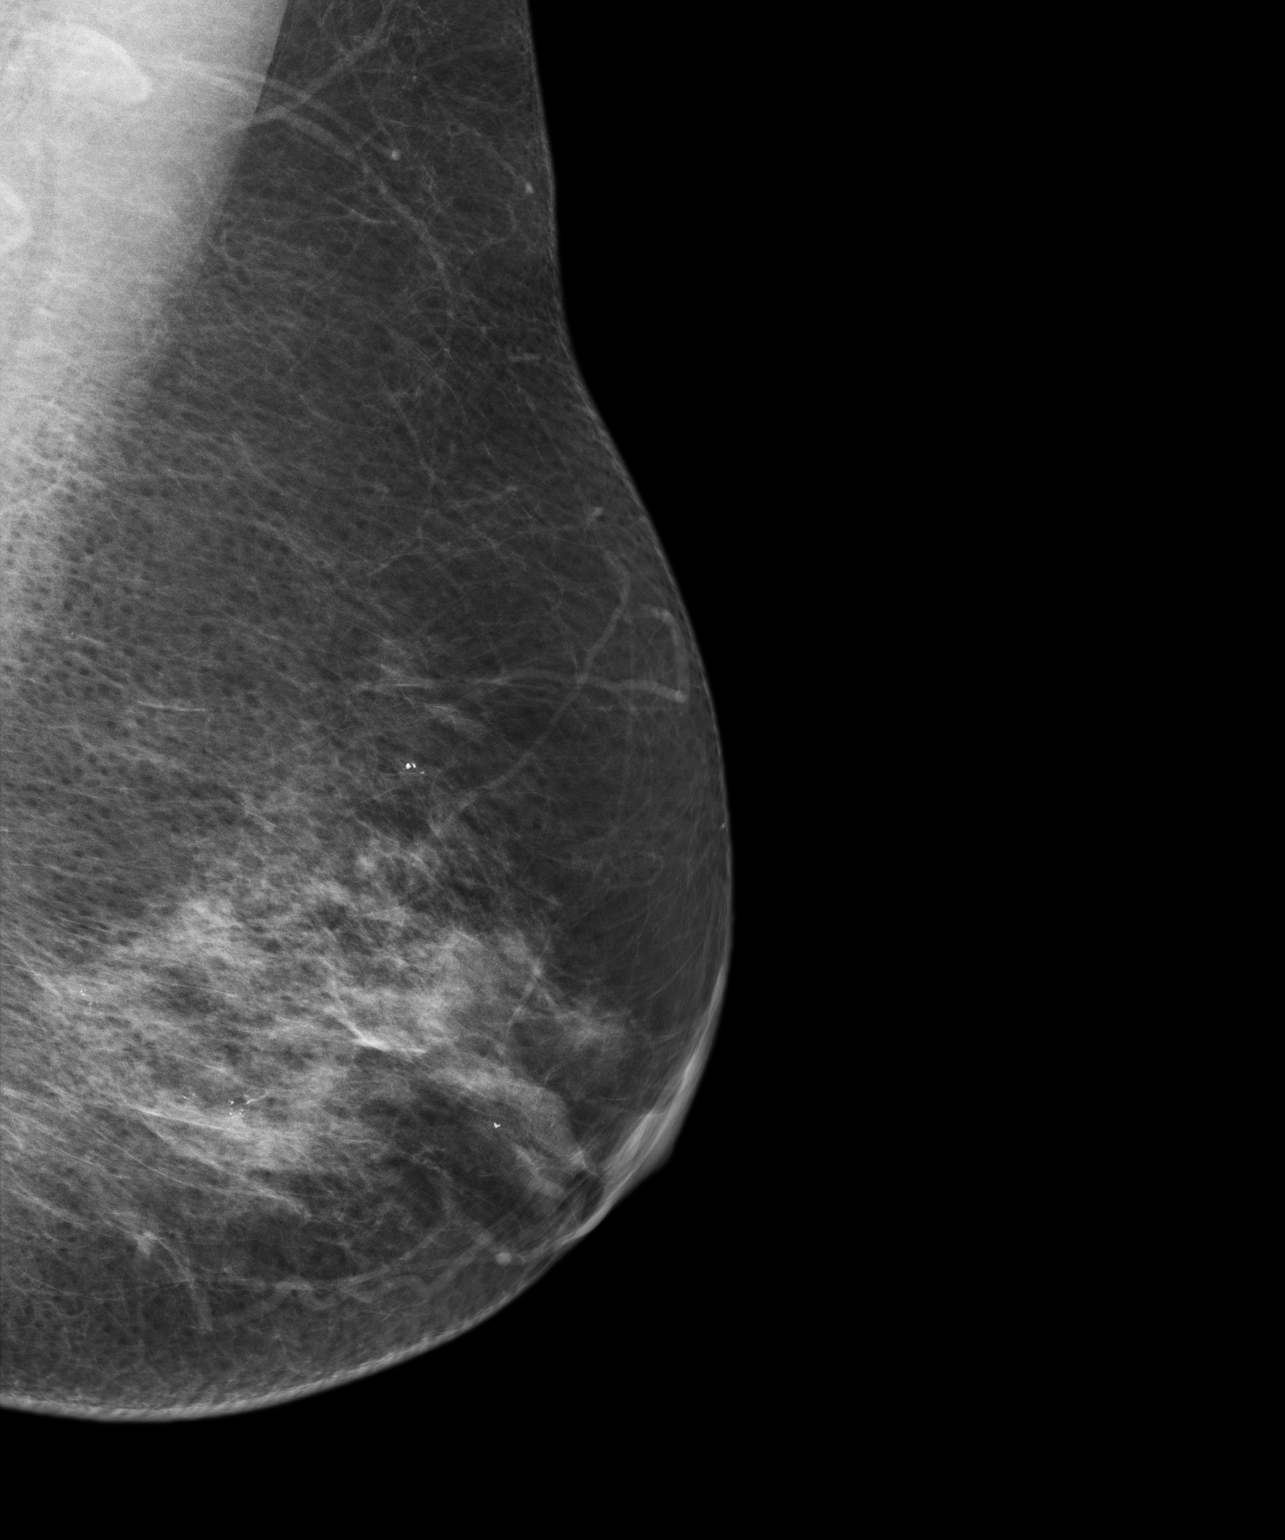
[im 3/3]
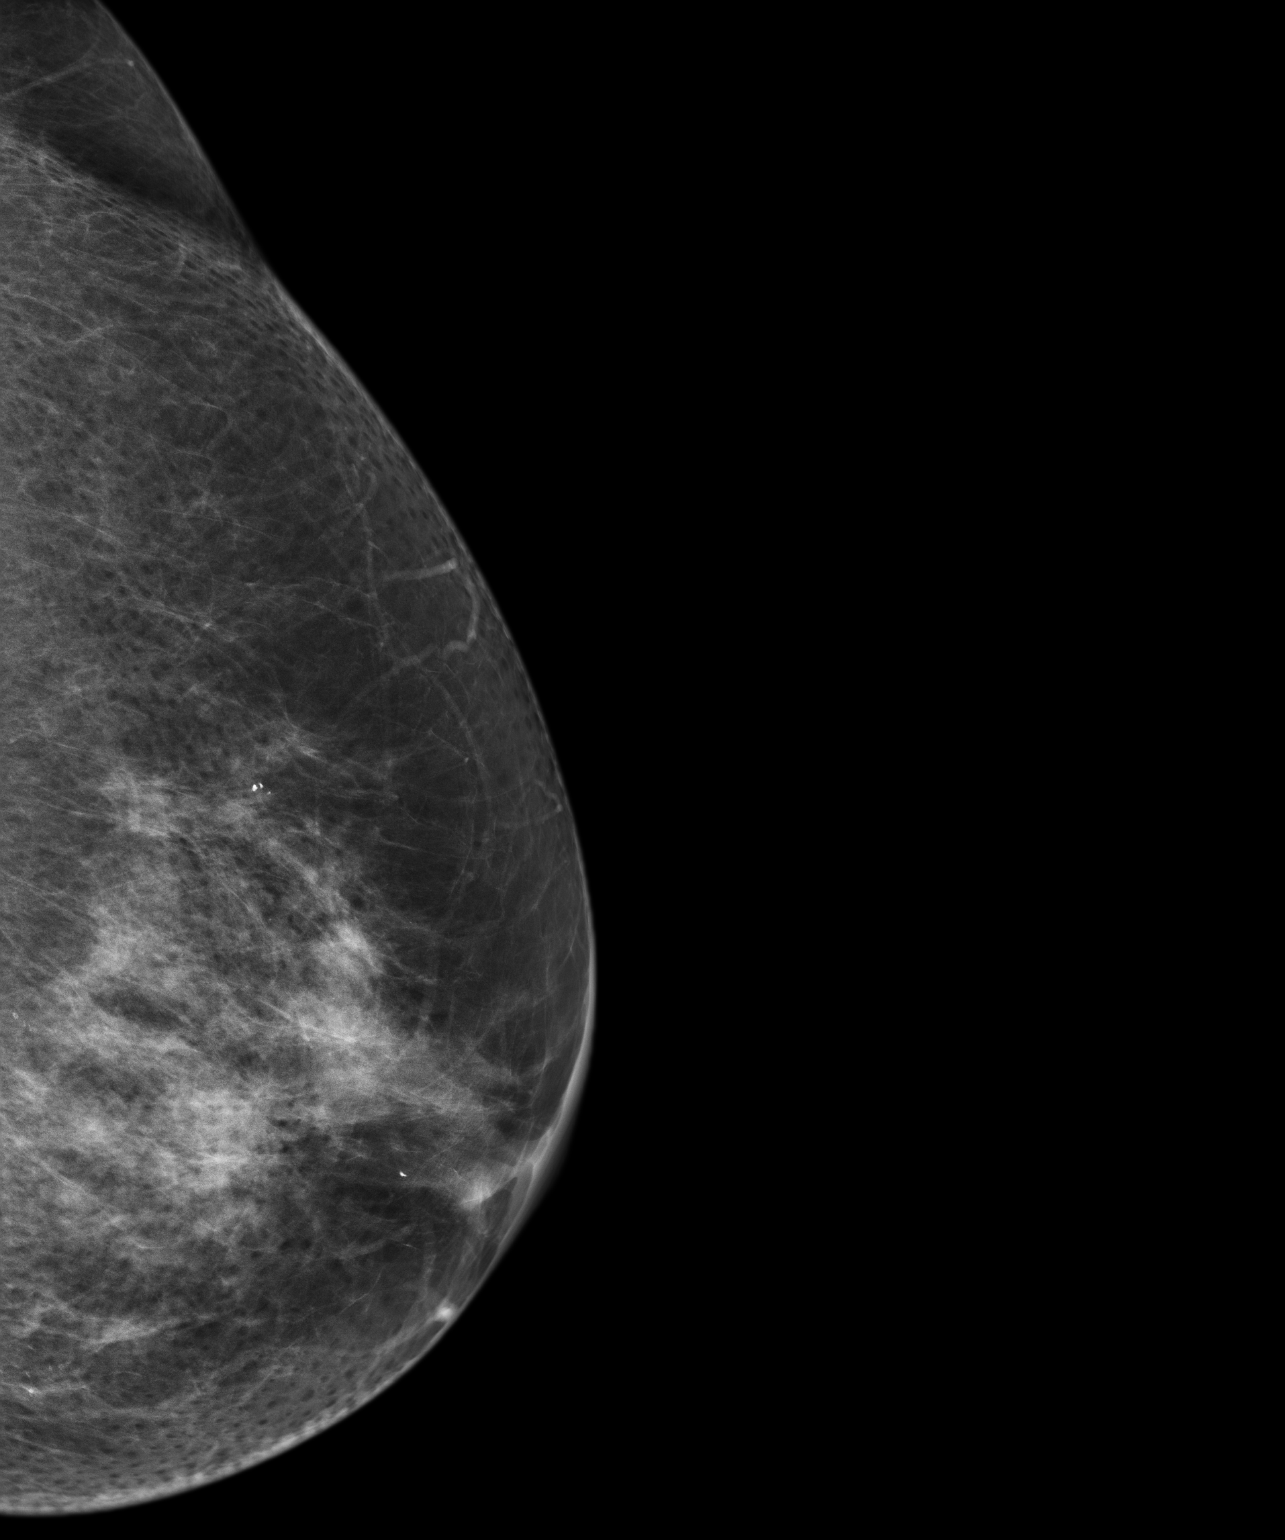

[3 of 3 positions shown; findings below may reference images not displayed]

ACR Breast Density Category b: There are scattered areas of
fibroglandular density.
FINDINGS: There are no findings suspicious for malignancy.
IMPRESSION: No mammographic evidence of malignancy. A result letter of this
screening mammogram will be mailed directly to the patient.

RECOMMENDATION:
Screening mammogram in one year. (Code:LI-1-N0W)

BI-RADS CATEGORY  1: Negative.

## 2013-10-29 ENCOUNTER — Ambulatory Visit: Payer: Self-pay | Admitting: Internal Medicine

## 2013-10-29 LAB — CBC CANCER CENTER
BASOS PCT: 1.5 %
Basophil #: 0.1 x10 3/mm (ref 0.0–0.1)
Eosinophil #: 0.2 x10 3/mm (ref 0.0–0.7)
Eosinophil %: 4.6 %
HCT: 35 % (ref 35.0–47.0)
HGB: 11.2 g/dL — AB (ref 12.0–16.0)
Lymphocyte #: 1 x10 3/mm (ref 1.0–3.6)
Lymphocyte %: 21.5 %
MCH: 31 pg (ref 26.0–34.0)
MCHC: 32 g/dL (ref 32.0–36.0)
MCV: 97 fL (ref 80–100)
Monocyte #: 0.4 x10 3/mm (ref 0.2–0.9)
Monocyte %: 9.2 %
Neutrophil #: 2.9 x10 3/mm (ref 1.4–6.5)
Neutrophil %: 63.2 %
PLATELETS: 97 x10 3/mm — AB (ref 150–440)
RBC: 3.62 10*6/uL — ABNORMAL LOW (ref 3.80–5.20)
RDW: 13.3 % (ref 11.5–14.5)
WBC: 4.7 x10 3/mm (ref 3.6–11.0)

## 2013-11-28 ENCOUNTER — Ambulatory Visit: Payer: Self-pay | Admitting: Internal Medicine

## 2014-08-05 ENCOUNTER — Ambulatory Visit
Admission: RE | Admit: 2014-08-05 | Discharge: 2014-08-05 | Disposition: A | Discharge status: Home / Self Care | Payer: Medicare PPO | Source: Ambulatory Visit | Attending: Unknown Physician Specialty | Admitting: Unknown Physician Specialty

## 2014-08-05 ENCOUNTER — Ambulatory Visit: Payer: Medicare PPO | Admitting: Anesthesiology

## 2014-08-05 ENCOUNTER — Encounter
Admission: RE | Disposition: A | Discharge status: Home / Self Care | Payer: Self-pay | Source: Ambulatory Visit | Attending: Unknown Physician Specialty

## 2014-08-05 DIAGNOSIS — Z79899 Other long term (current) drug therapy: Secondary | ICD-10-CM | POA: Insufficient documentation

## 2014-08-05 DIAGNOSIS — Z09 Encounter for follow-up examination after completed treatment for conditions other than malignant neoplasm: Secondary | ICD-10-CM | POA: Insufficient documentation

## 2014-08-05 DIAGNOSIS — I10 Essential (primary) hypertension: Secondary | ICD-10-CM | POA: Diagnosis not present

## 2014-08-05 DIAGNOSIS — K640 First degree hemorrhoids: Secondary | ICD-10-CM | POA: Diagnosis not present

## 2014-08-05 DIAGNOSIS — E559 Vitamin D deficiency, unspecified: Secondary | ICD-10-CM | POA: Insufficient documentation

## 2014-08-05 DIAGNOSIS — E785 Hyperlipidemia, unspecified: Secondary | ICD-10-CM | POA: Diagnosis not present

## 2014-08-05 DIAGNOSIS — Z86010 Personal history of colonic polyps: Secondary | ICD-10-CM | POA: Insufficient documentation

## 2014-08-05 DIAGNOSIS — E119 Type 2 diabetes mellitus without complications: Secondary | ICD-10-CM | POA: Insufficient documentation

## 2014-08-05 HISTORY — DX: Malignant (primary) neoplasm, unspecified: C80.1

## 2014-08-05 HISTORY — DX: Major depressive disorder, single episode, unspecified: F32.9

## 2014-08-05 HISTORY — PX: COLONOSCOPY WITH PROPOFOL: SHX5780

## 2014-08-05 HISTORY — DX: Essential (primary) hypertension: I10

## 2014-08-05 HISTORY — DX: Depression, unspecified: F32.A

## 2014-08-05 HISTORY — DX: Vitamin D deficiency, unspecified: E55.9

## 2014-08-05 HISTORY — DX: Hyperlipidemia, unspecified: E78.5

## 2014-08-05 HISTORY — DX: Anemia, unspecified: D64.9

## 2014-08-05 HISTORY — DX: Type 2 diabetes mellitus without complications: E11.9

## 2014-08-05 HISTORY — DX: Age-related osteoporosis without current pathological fracture: M81.0

## 2014-08-05 LAB — GLUCOSE, CAPILLARY: GLUCOSE-CAPILLARY: 188 mg/dL — AB (ref 65–99)

## 2014-08-05 SURGERY — COLONOSCOPY WITH PROPOFOL
Anesthesia: General

## 2014-08-05 MED ORDER — FENTANYL CITRATE (PF) 100 MCG/2ML IJ SOLN
INTRAMUSCULAR | Status: DC | PRN
  Administered 2014-08-05: 50 ug via INTRAVENOUS

## 2014-08-05 MED ORDER — SODIUM CHLORIDE 0.9 % IV SOLN
INTRAVENOUS | Status: DC
  Administered 2014-08-05: 1000 mL via INTRAVENOUS

## 2014-08-05 MED ORDER — EPHEDRINE SULFATE 50 MG/ML IJ SOLN
INTRAMUSCULAR | Status: DC | PRN
  Administered 2014-08-05: 10 mg via INTRAVENOUS

## 2014-08-05 MED ORDER — SODIUM CHLORIDE 0.9 % IV SOLN
INTRAVENOUS | Status: DC
  Administered 2014-08-05: 11:00:00 via INTRAVENOUS

## 2014-08-05 MED ORDER — PROPOFOL 10 MG/ML IV BOLUS
INTRAVENOUS | Status: DC | PRN
  Administered 2014-08-05: 50 mg via INTRAVENOUS

## 2014-08-05 MED ORDER — PROPOFOL INFUSION 10 MG/ML OPTIME
INTRAVENOUS | Status: DC | PRN
  Administered 2014-08-05: 200 ug/kg/min via INTRAVENOUS

## 2014-08-05 NOTE — H&P (Signed)
Primary Care Physician:  Gayland Curry, MD Primary Gastroenterologist:  Dr. Vira Agar  Pre-Procedure History & Physical: HPI:  Terri Collins is a 69 y.o. female is here for an colonoscopy.   Past Medical History  Diagnosis Date  . Hypertension   . Depression   . Cancer   . Diabetes mellitus without complication   . Anemia   . Hyperlipidemia   . Osteoporosis   . Vitamin D deficiency     Past Surgical History  Procedure Laterality Date  . Breast surgery    . Colonoscopy    . Esophagogastroduodenoscopy      Prior to Admission medications   Medication Sig Start Date End Date Taking? Authorizing Provider  alendronate (FOSAMAX) 70 MG tablet Take 70 mg by mouth once a week. Take with a full glass of water on an empty stomach.   Yes Historical Provider, MD  Cholecalciferol (VITAMIN D3) 2000 UNITS capsule Take 2,000 Units by mouth daily.   Yes Historical Provider, MD  enalapril (VASOTEC) 10 MG tablet Take 10 mg by mouth daily.   Yes Historical Provider, MD  glipiZIDE (GLUCOTROL) 5 MG tablet Take 5 mg by mouth daily before breakfast.   Yes Historical Provider, MD  glucose blood test strip 1 each by Other route as needed for other. Use as instructed   Yes Historical Provider, MD  hydrochlorothiazide (HYDRODIURIL) 25 MG tablet Take 25 mg by mouth daily.   Yes Historical Provider, MD  lovastatin (MEVACOR) 20 MG tablet Take 20 mg by mouth at bedtime.   Yes Historical Provider, MD  metFORMIN (GLUCOPHAGE) 500 MG tablet Take 1,000 mg by mouth 2 (two) times daily with a meal.   Yes Historical Provider, MD    Allergies as of 06/16/2014  . (Not on File)    History reviewed. No pertinent family history.  History   Social History  . Marital Status: Married    Spouse Name: N/A  . Number of Children: N/A  . Years of Education: N/A   Occupational History  . Not on file.   Social History Main Topics  . Smoking status: Never Smoker   . Smokeless tobacco: Not on file  . Alcohol  Use: Not on file  . Drug Use: Not on file  . Sexual Activity: Not on file   Other Topics Concern  . Not on file   Social History Narrative  . No narrative on file    Review of Systems: See HPI, otherwise negative ROS  Physical Exam: BP 137/67 mmHg  Pulse 79  Temp(Src) 98.2 F (36.8 C) (Oral)  Resp 20 General:   Alert,  pleasant and cooperative in NAD Head:  Normocephalic and atraumatic. Neck:  Supple; no masses or thyromegaly. Lungs:  Clear throughout to auscultation.    Heart:  Regular rate and rhythm. Abdomen:  Soft, nontender and nondistended. Normal bowel sounds, without guarding, and without rebound.   Neurologic:  Alert and  oriented x4;  grossly normal neurologically.  Impression/Plan: [place] is here for an colonoscopy to be performed for personal history of colon polyps  Risks, benefits, limitations, and alternatives regarding  colonoscopy have been reviewed with the patient.  Questions have been answered.  All parties agreeable.   Gaylyn Cheers, MD  08/05/2014, 10:30 AM   Primary Care Physician:  Gayland Curry, MD Primary Gastroenterologist:  Dr. Vira Agar  Pre-Procedure History & Physical: HPI:  Terri Collins is a 69 y.o. female is here for an colonoscopy.   Past Medical History  Diagnosis Date  . Hypertension   . Depression   . Cancer   . Diabetes mellitus without complication   . Anemia   . Hyperlipidemia   . Osteoporosis   . Vitamin D deficiency     Past Surgical History  Procedure Laterality Date  . Breast surgery    . Colonoscopy    . Esophagogastroduodenoscopy      Prior to Admission medications   Medication Sig Start Date End Date Taking? Authorizing Provider  alendronate (FOSAMAX) 70 MG tablet Take 70 mg by mouth once a week. Take with a full glass of water on an empty stomach.   Yes Historical Provider, MD  Cholecalciferol (VITAMIN D3) 2000 UNITS capsule Take 2,000 Units by mouth daily.   Yes Historical Provider, MD   enalapril (VASOTEC) 10 MG tablet Take 10 mg by mouth daily.   Yes Historical Provider, MD  glipiZIDE (GLUCOTROL) 5 MG tablet Take 5 mg by mouth daily before breakfast.   Yes Historical Provider, MD  glucose blood test strip 1 each by Other route as needed for other. Use as instructed   Yes Historical Provider, MD  hydrochlorothiazide (HYDRODIURIL) 25 MG tablet Take 25 mg by mouth daily.   Yes Historical Provider, MD  lovastatin (MEVACOR) 20 MG tablet Take 20 mg by mouth at bedtime.   Yes Historical Provider, MD  metFORMIN (GLUCOPHAGE) 500 MG tablet Take 1,000 mg by mouth 2 (two) times daily with a meal.   Yes Historical Provider, MD    Allergies as of 06/16/2014  . (Not on File)    History reviewed. No pertinent family history.  History   Social History  . Marital Status: Married    Spouse Name: N/A  . Number of Children: N/A  . Years of Education: N/A   Occupational History  . Not on file.   Social History Main Topics  . Smoking status: Never Smoker   . Smokeless tobacco: Not on file  . Alcohol Use: Not on file  . Drug Use: Not on file  . Sexual Activity: Not on file   Other Topics Concern  . Not on file   Social History Narrative  . No narrative on file    Review of Systems: See HPI, otherwise negative ROS  Physical Exam: BP 137/67 mmHg  Pulse 79  Temp(Src) 98.2 F (36.8 C) (Oral)  Resp 20 General:   Alert,  pleasant and cooperative in NAD Head:  Normocephalic and atraumatic. Neck:  Supple; no masses or thyromegaly. Lungs:  Clear throughout to auscultation.    Heart:  Regular rate and rhythm. Abdomen:  Soft, nontender and nondistended. Normal bowel sounds, without guarding, and without rebound.   Neurologic:  Alert and  oriented x4;  grossly normal neurologically.  Impression/Plan: [name] is here for an colonoscopy to be performed for follow up personal history of colon polyps.  Risks, benefits, limitations, and alternatives regarding   colonoscopy have been reviewed with the patient.  Questions have been answered.  All parties agreeable.   Gaylyn Cheers, MD  08/05/2014, 10:30 AM

## 2014-08-05 NOTE — Anesthesia Preprocedure Evaluation (Signed)
Anesthesia Evaluation  Patient identified by MRN, date of birth, ID band Patient awake    Reviewed: Allergy & Precautions, NPO status , Patient's Chart, lab work & pertinent test results  Airway Mallampati: II  TM Distance: >3 FB Neck ROM: Limited    Dental  (+) Teeth Intact   Pulmonary    Pulmonary exam normal       Cardiovascular hypertension, Pt. on medications Normal cardiovascular exam    Neuro/Psych    GI/Hepatic   Endo/Other  diabetes, Type 2  Renal/GU      Musculoskeletal   Abdominal (+)  Abdomen: soft.    Peds  Hematology   Anesthesia Other Findings   Reproductive/Obstetrics                             Anesthesia Physical Anesthesia Plan  ASA: III  Anesthesia Plan: General   Post-op Pain Management:    Induction: Intravenous  Airway Management Planned: Nasal Cannula  Additional Equipment:   Intra-op Plan:   Post-operative Plan:   Informed Consent: I have reviewed the patients History and Physical, chart, labs and discussed the procedure including the risks, benefits and alternatives for the proposed anesthesia with the patient or authorized representative who has indicated his/her understanding and acceptance.     Plan Discussed with: CRNA  Anesthesia Plan Comments:         Anesthesia Quick Evaluation

## 2014-08-05 NOTE — Op Note (Signed)
[facility] Gastroenterology Patient Name: Terri Collins Procedure Date: 08/05/2014 10:29 AM MRN: [id number] Account #: [id number] Date of Birth: 1945/05/08 Admit Type: Outpatient Age: 69 Room: [facility] ENDO ROOM 1 Gender: Female Note Status: Finalized Procedure:         Colonoscopy Indications:       Follow-up for history of adenomatous polyps in the colon Providers:         Manya Silvas, MD Referring MD:      Gayland Curry, MD (Referring MD) Medicines:         Propofol per Anesthesia Complications:     No immediate complications. Procedure:         Pre-Anesthesia Assessment:                    - After reviewing the risks and benefits, the patient was                     deemed in satisfactory condition to undergo the procedure.                    After obtaining informed consent, the colonoscope was                     passed under direct vision. Throughout the procedure, the                     patient's blood pressure, pulse, and oxygen saturations                     were monitored continuously. The Olympus PCF-160AL                     colonoscope (S#. [id number]) was introduced through the anus                     and advanced to the the cecum, identified by appendiceal                     orifice and ileocecal valve. The colonoscopy was performed                     without difficulty. The patient tolerated the procedure                     well. The quality of the bowel preparation was excellent. Findings:      Internal hemorrhoids were found during endoscopy. The hemorrhoids were       small and Grade I (internal hemorrhoids that do not prolapse). A few       small faint pink red spots seen in ascending colon bu no evidence of       bleeding.      The exam was otherwise without abnormality. Impression:        - Internal hemorrhoids.                    - The examination was otherwise normal.                    - No specimens  collected. Recommendation:    - The findings and recommendations were discussed with the                     patient. And family. Repeat in 5-10 years. Manya Silvas, MD 08/05/2014  10:58:43 AM This report has been signed electronically. Number of Addenda: 0 Note Initiated On: 08/05/2014 10:29 AM Scope Withdrawal Time: 0 hours 7 minutes 38 seconds  Total Procedure Duration: 0 hours 17 minutes 42 seconds       [facility]

## 2014-08-05 NOTE — Transfer of Care (Signed)
Immediate Anesthesia Transfer of Care Note  Patient: Terri Collins  Procedure(s) Performed: Procedure(s): COLONOSCOPY WITH PROPOFOL (N/A)  Patient Location: PACU  Anesthesia Type:General  Level of Consciousness: sedated  Airway & Oxygen Therapy: Patient Spontanous Breathing and Patient connected to nasal cannula oxygen  Post-op Assessment: Report given to RN and Post -op Vital signs reviewed and stable  Post vital signs: Reviewed and stable  Last Vitals:  Filed Vitals:   08/05/14 0940  BP: 137/67  Pulse: 79  Temp: 36.8 C  Resp: 20    Complications: No apparent anesthesia complications

## 2014-08-05 NOTE — Anesthesia Postprocedure Evaluation (Signed)
  Anesthesia Post-op Note  Patient: Terri Collins  Procedure(s) Performed: Procedure(s): COLONOSCOPY WITH PROPOFOL (N/A)  Anesthesia type:General  Patient location: PACU  Post pain: Pain level controlled  Post assessment: Post-op Vital signs reviewed, Patient's Cardiovascular Status Stable, Respiratory Function Stable, Patent Airway and No signs of Nausea or vomiting  Post vital signs: Reviewed and stable  Last Vitals:  Filed Vitals:   08/05/14 1058  BP: 124/49  Pulse: 85  Temp: 36.4 C  Resp: 16    Level of consciousness: awake, alert  and patient cooperative  Complications: No apparent anesthesia complications

## 2014-08-08 ENCOUNTER — Encounter: Payer: Self-pay | Admitting: Unknown Physician Specialty

## 2014-08-25 ENCOUNTER — Other Ambulatory Visit: Payer: Self-pay | Admitting: Family Medicine

## 2014-08-25 DIAGNOSIS — Z1231 Encounter for screening mammogram for malignant neoplasm of breast: Secondary | ICD-10-CM

## 2014-08-30 ENCOUNTER — Other Ambulatory Visit: Payer: Self-pay | Admitting: Family Medicine

## 2014-08-30 DIAGNOSIS — Z1231 Encounter for screening mammogram for malignant neoplasm of breast: Secondary | ICD-10-CM

## 2014-08-31 ENCOUNTER — Other Ambulatory Visit: Payer: Self-pay | Admitting: Family Medicine

## 2014-08-31 ENCOUNTER — Ambulatory Visit
Admission: RE | Admit: 2014-08-31 | Discharge: 2014-08-31 | Disposition: A | Discharge status: Home / Self Care | Payer: Medicare PPO | Source: Ambulatory Visit | Attending: Family Medicine | Admitting: Family Medicine

## 2014-08-31 DIAGNOSIS — Z1231 Encounter for screening mammogram for malignant neoplasm of breast: Secondary | ICD-10-CM

## 2014-08-31 DIAGNOSIS — Z9011 Acquired absence of right breast and nipple: Secondary | ICD-10-CM | POA: Diagnosis not present

## 2014-08-31 IMAGING — MG MM SCREENING BREAST TOMO BILATERAL
7 series · 9 of 15 positions shown · non-contrast
Comparison: Previous exam(s).

CLINICAL DATA: Screening.

EXAM:
DIGITAL SCREENING BILATERAL MAMMOGRAM WITH 3D TOMO WITH CAD

[L MLO (1 of 2)]
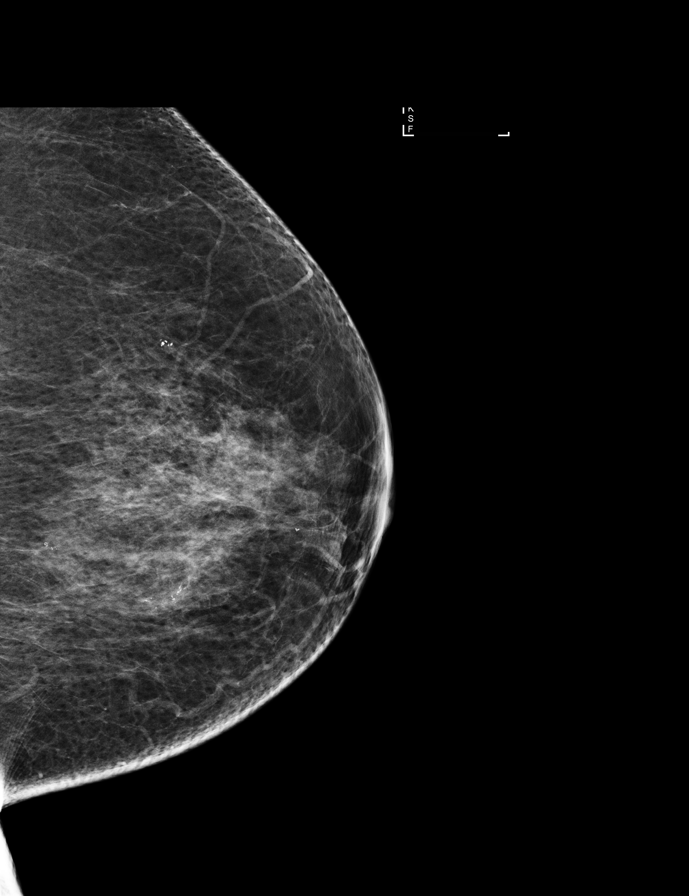

[L CC synth-2D]
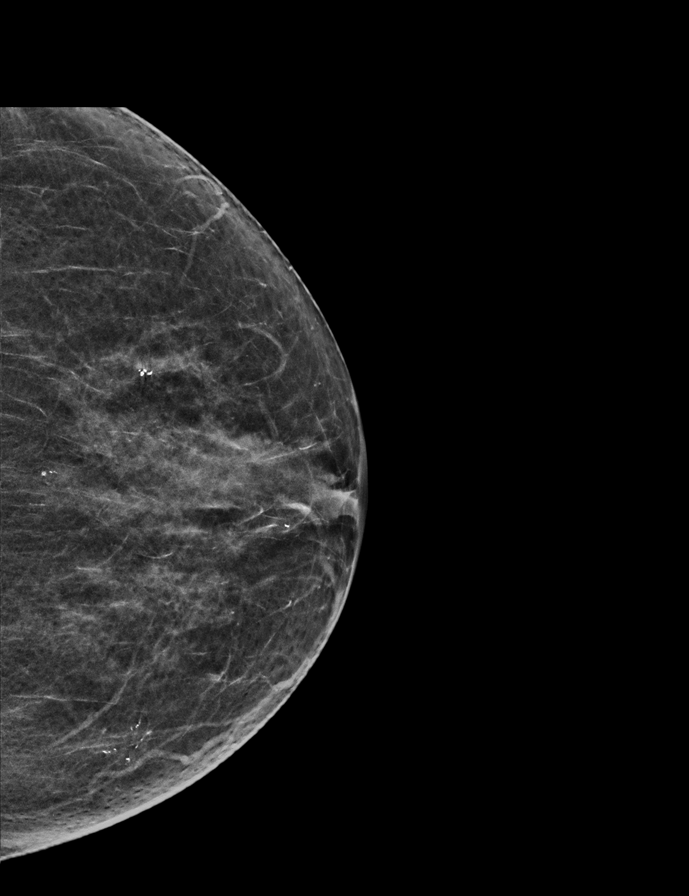

[L MLO synth-2D]
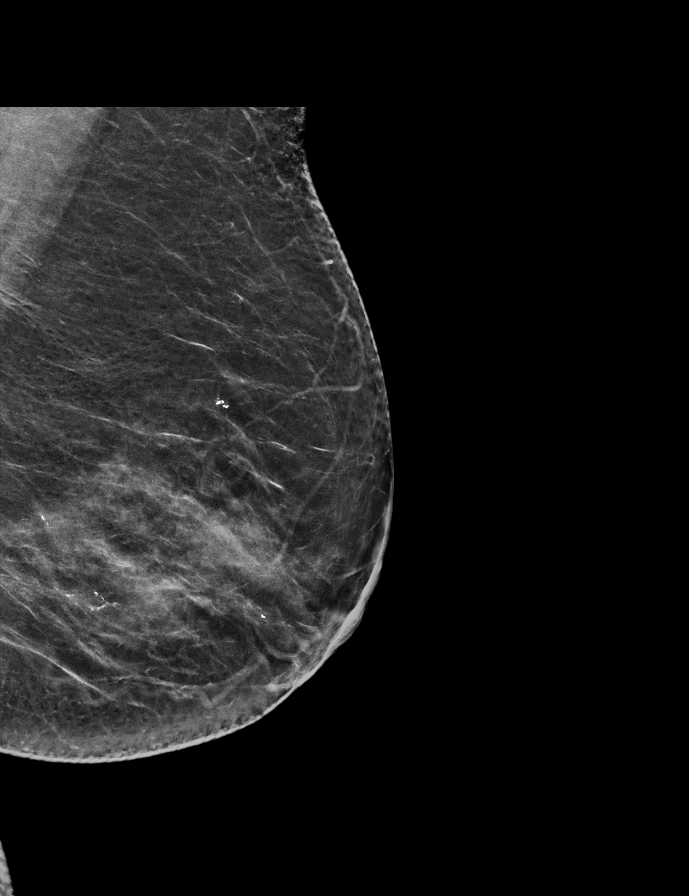

[L MLO (2 of 2)]
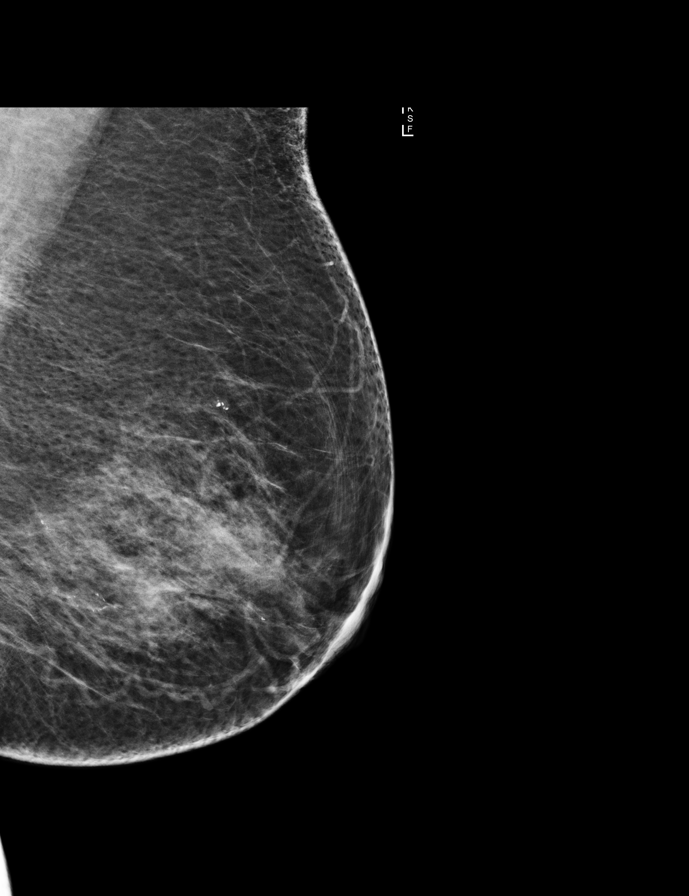

[L CC]
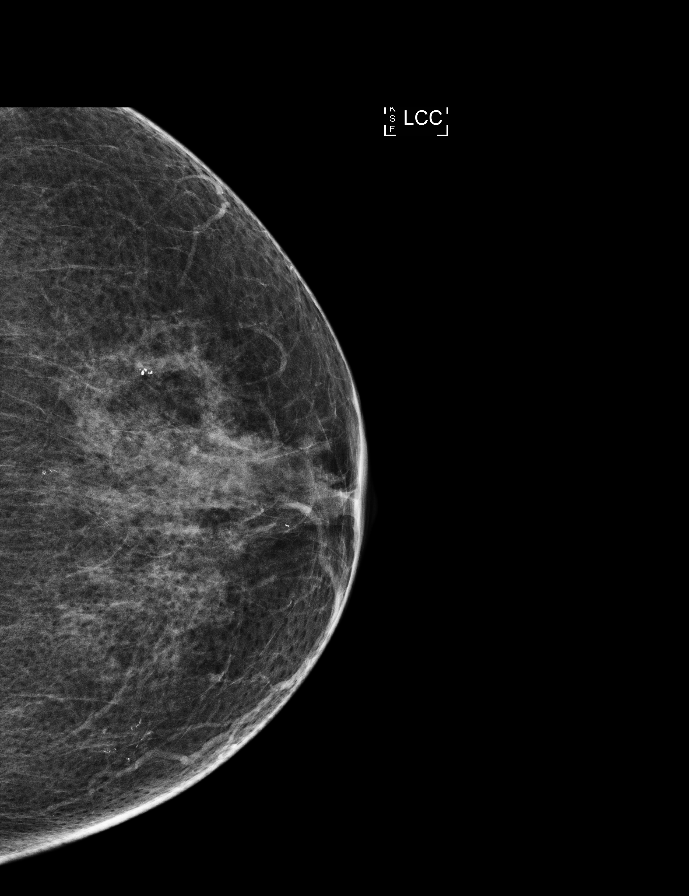

[L CC tomo · 3 of 52 frames shown]
[frame 17/52]
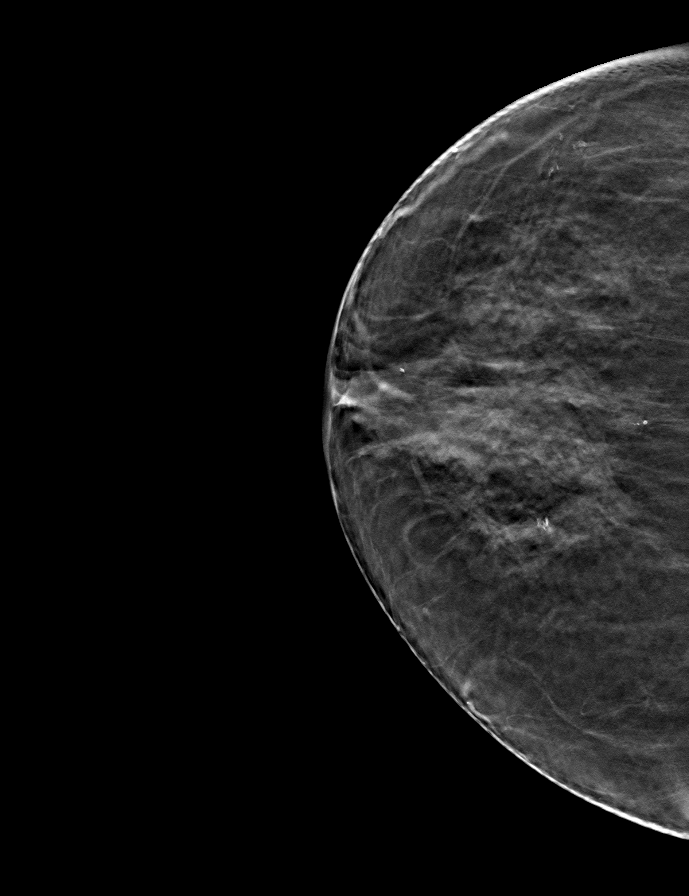
[frame 27/52]
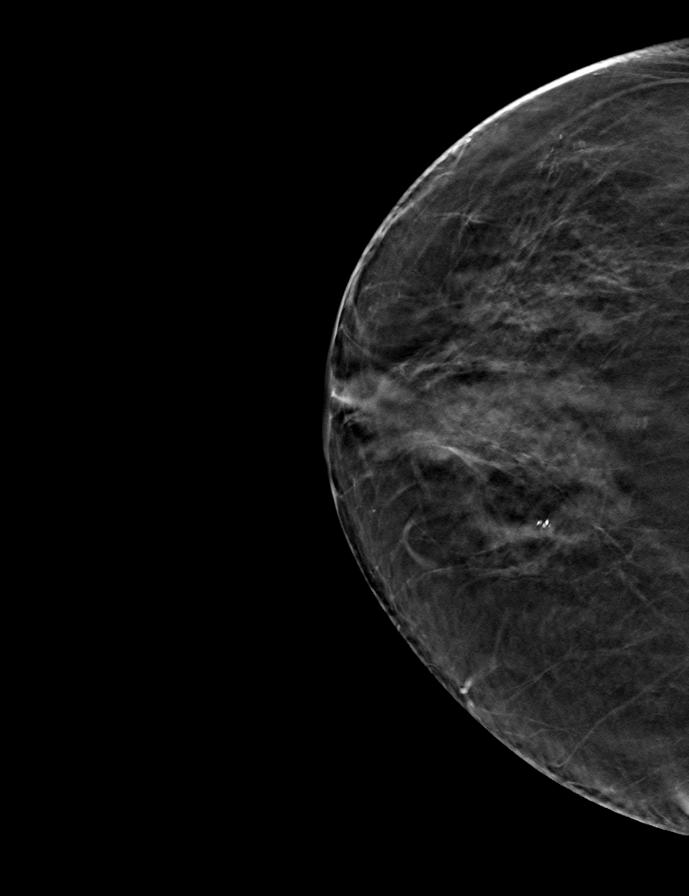
[frame 36/52]
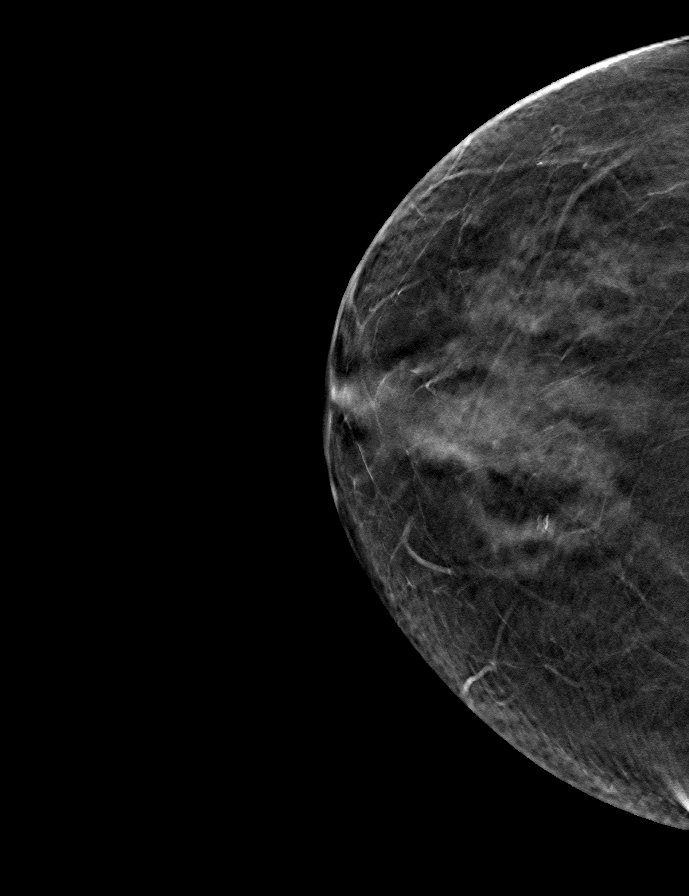

[L MLO tomo · tomo slice 32/63.0]
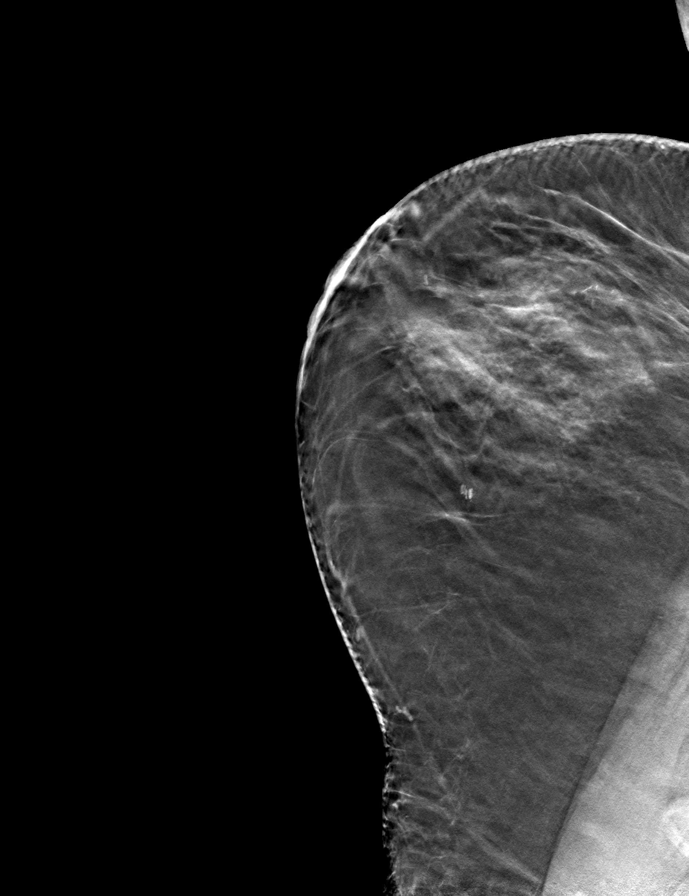

[9 of 15 positions shown; findings below may reference images not displayed]

ACR Breast Density Category c: The breast tissue is heterogeneously
dense, which may obscure small masses.
FINDINGS: The patient has had a right mastectomy. There are no findings
suspicious for malignancy.

Images were processed with CAD.
IMPRESSION: No mammographic evidence of malignancy. A result letter of this
screening mammogram will be mailed directly to the patient.

RECOMMENDATION:
Screening mammogram in one year.  ([id number])

BI-RADS CATEGORY  1: Negative.

## 2015-04-05 ENCOUNTER — Other Ambulatory Visit: Payer: Self-pay | Admitting: Family Medicine

## 2015-04-05 DIAGNOSIS — R945 Abnormal results of liver function studies: Secondary | ICD-10-CM

## 2015-04-07 ENCOUNTER — Other Ambulatory Visit: Payer: Self-pay | Admitting: Family Medicine

## 2015-04-07 DIAGNOSIS — Z780 Asymptomatic menopausal state: Secondary | ICD-10-CM

## 2015-04-12 ENCOUNTER — Other Ambulatory Visit: Payer: Self-pay | Admitting: Family Medicine

## 2015-04-12 DIAGNOSIS — Z1231 Encounter for screening mammogram for malignant neoplasm of breast: Secondary | ICD-10-CM

## 2015-09-04 ENCOUNTER — Other Ambulatory Visit: Payer: Medicare PPO

## 2015-09-04 ENCOUNTER — Ambulatory Visit: Payer: Medicare PPO | Attending: Family Medicine

## 2015-09-28 ENCOUNTER — Other Ambulatory Visit: Payer: Self-pay | Admitting: Family Medicine

## 2015-09-28 ENCOUNTER — Ambulatory Visit
Admission: RE | Admit: 2015-09-28 | Discharge: 2015-09-28 | Disposition: A | Discharge status: Home / Self Care | Payer: Medicare PPO | Source: Ambulatory Visit | Attending: Family Medicine | Admitting: Family Medicine

## 2015-09-28 DIAGNOSIS — Z1231 Encounter for screening mammogram for malignant neoplasm of breast: Secondary | ICD-10-CM | POA: Insufficient documentation

## 2015-09-28 DIAGNOSIS — M85852 Other specified disorders of bone density and structure, left thigh: Secondary | ICD-10-CM | POA: Insufficient documentation

## 2015-09-28 DIAGNOSIS — Z780 Asymptomatic menopausal state: Secondary | ICD-10-CM | POA: Diagnosis present

## 2015-09-28 DIAGNOSIS — M8588 Other specified disorders of bone density and structure, other site: Secondary | ICD-10-CM | POA: Insufficient documentation

## 2015-09-28 IMAGING — MG MM DIGITAL SCREENING UNILAT*L* W/ TOMO W/ CAD
6 series · 6 of 14 positions shown · non-contrast
Comparison: Previous exam(s).

CLINICAL DATA: Screening. History of right breast cancer status
post mastectomy in 4777.

EXAM:
2D DIGITAL SCREENING UNILATERAL LEFT MAMMOGRAM WITH CAD AND ADJUNCT
TOMO

[L CC synth-2D]
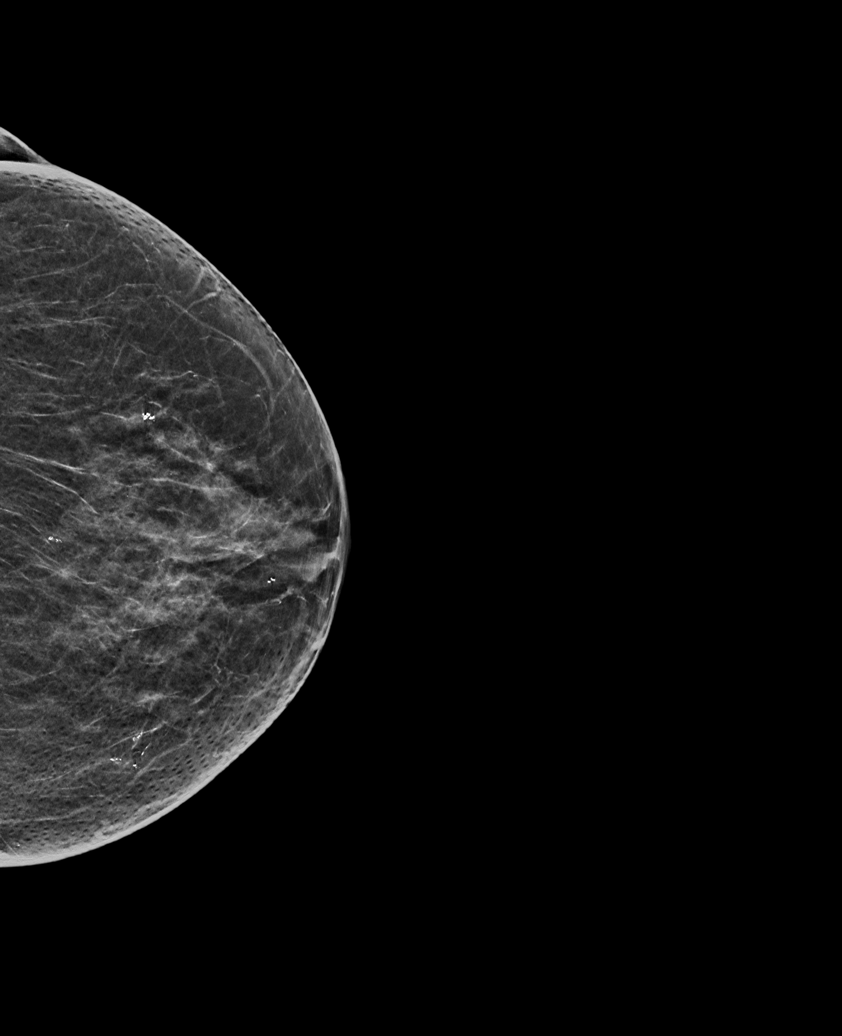

[L CC]
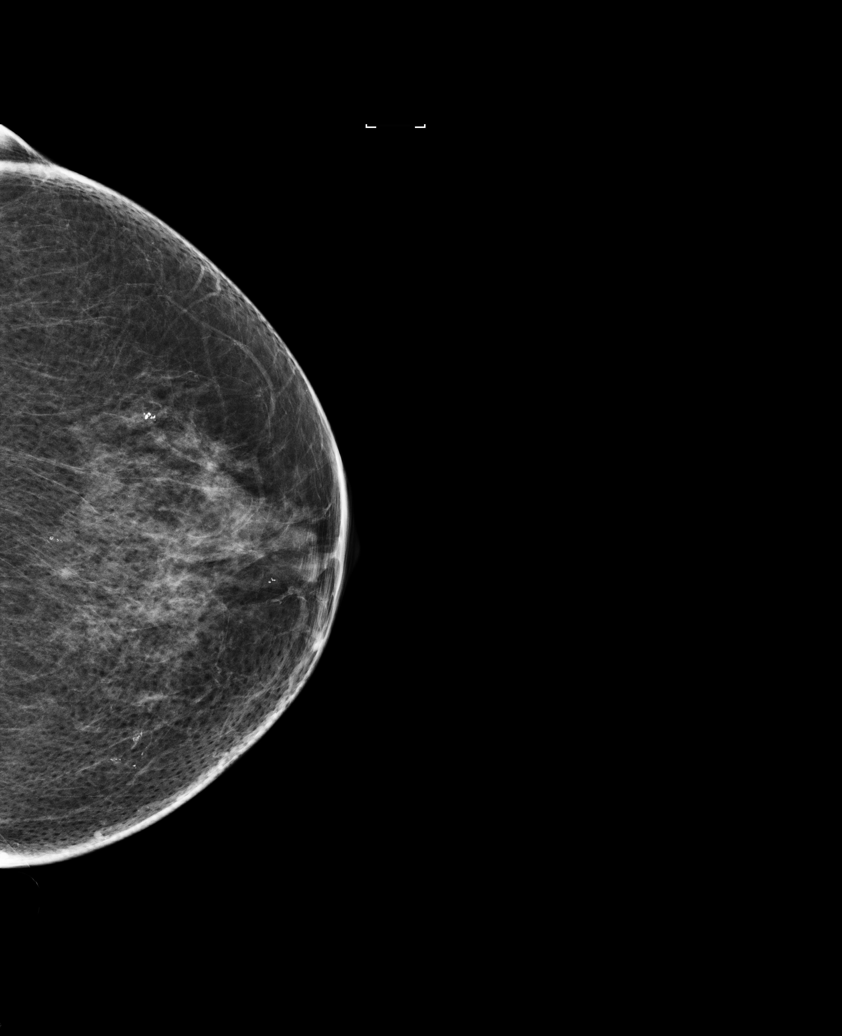

[L MLO]
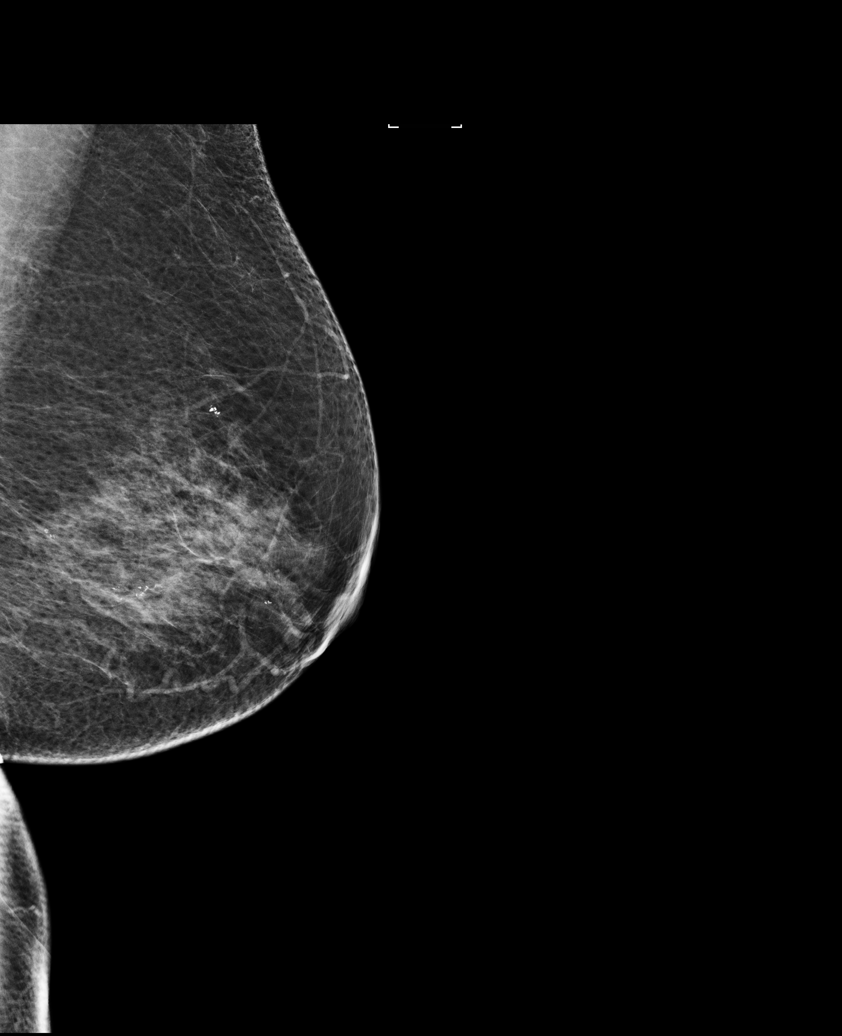

[L MLO synth-2D]
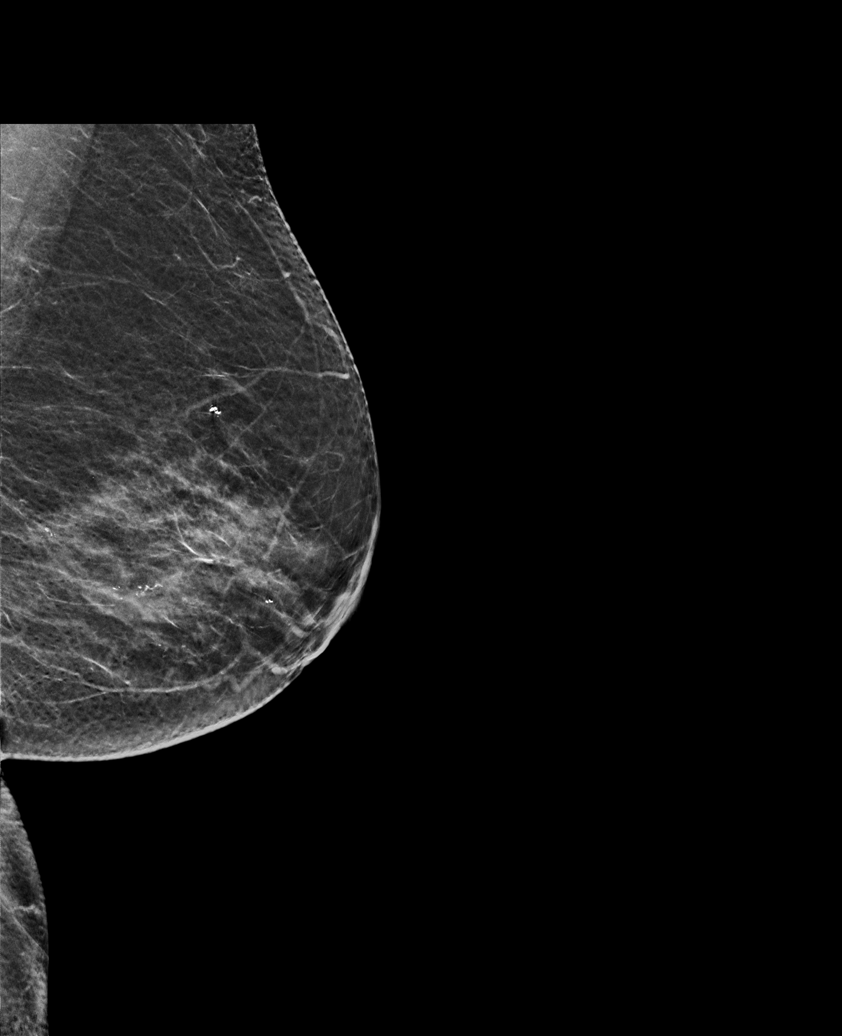

[L CC tomo · tomo slice 27/53.0]
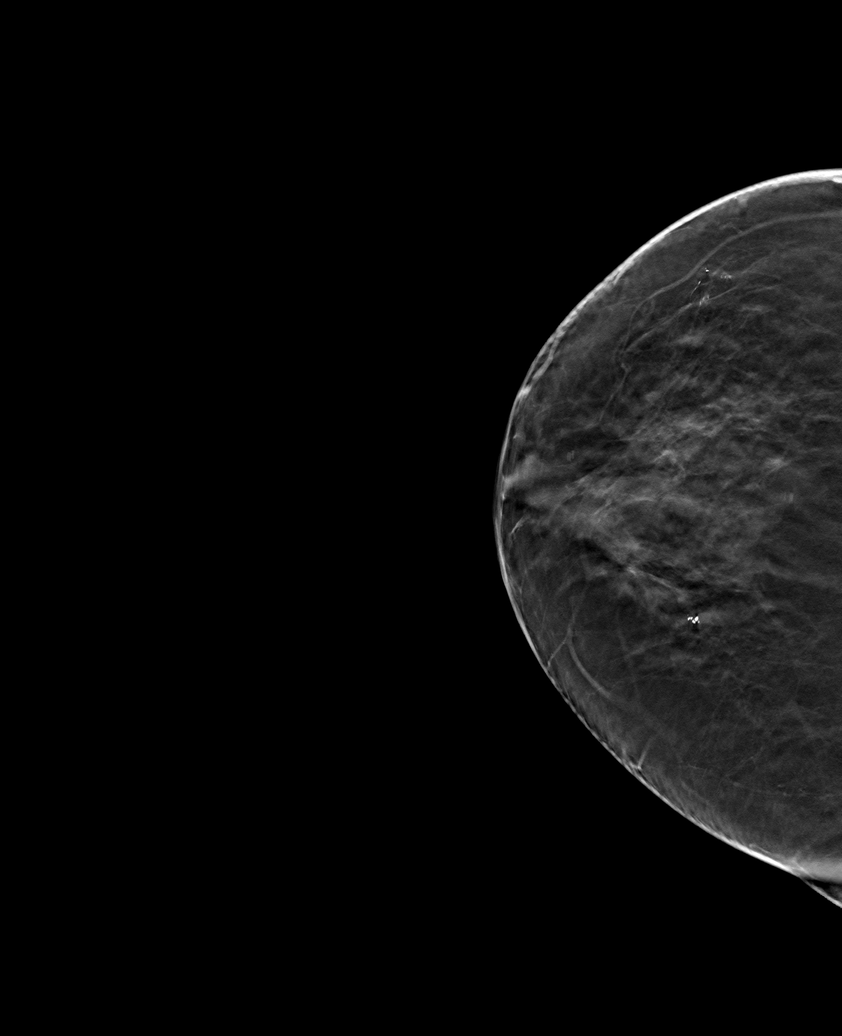

[L MLO tomo · tomo slice 29/57.0]
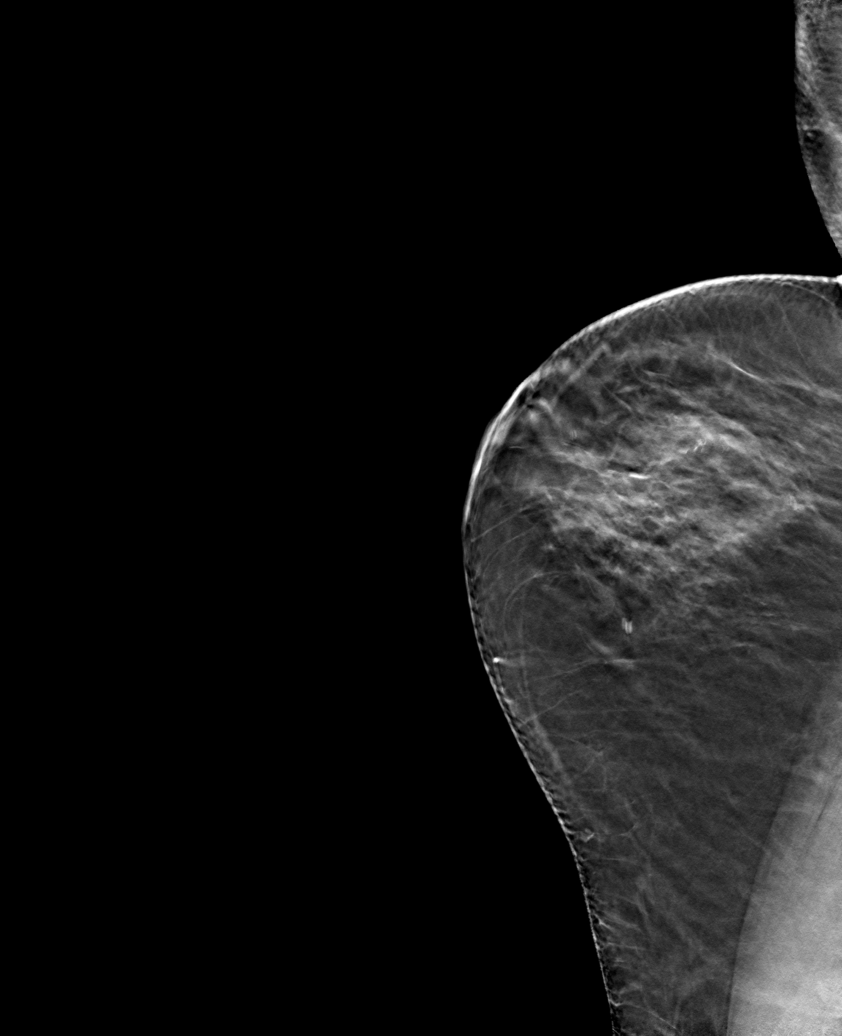

[6 of 14 positions shown; findings below may reference images not displayed]

ACR Breast Density Category b: There are scattered areas of
fibroglandular density.
FINDINGS: There are no findings suspicious for malignancy within the left
breast. Patient is status post right mastectomy. Images were
processed with CAD.
IMPRESSION: No mammographic evidence of malignancy. A result letter of this
screening mammogram will be mailed directly to the patient.

RECOMMENDATION:
Screening mammogram in one year. (Code:OQ-C-Q5A)

BI-RADS CATEGORY  1: Negative.

## 2016-07-23 ENCOUNTER — Other Ambulatory Visit: Payer: Self-pay | Admitting: Ophthalmology

## 2016-07-23 DIAGNOSIS — G5139 Clonic hemifacial spasm, unspecified: Secondary | ICD-10-CM

## 2016-08-06 ENCOUNTER — Other Ambulatory Visit
Admission: RE | Admit: 2016-08-06 | Discharge: 2016-08-06 | Disposition: A | Discharge status: Home / Self Care | Payer: Medicare PPO | Source: Ambulatory Visit | Attending: Ophthalmology | Admitting: Ophthalmology

## 2016-08-06 ENCOUNTER — Ambulatory Visit
Admission: RE | Admit: 2016-08-06 | Discharge: 2016-08-06 | Disposition: A | Discharge status: Home / Self Care | Payer: Medicare PPO | Source: Ambulatory Visit | Attending: Ophthalmology | Admitting: Ophthalmology

## 2016-08-06 DIAGNOSIS — G513 Clonic hemifacial spasm: Secondary | ICD-10-CM | POA: Insufficient documentation

## 2016-08-06 DIAGNOSIS — G5139 Clonic hemifacial spasm, unspecified: Secondary | ICD-10-CM

## 2016-08-06 LAB — CREATININE, SERUM: Creatinine, Ser: 0.77 mg/dL (ref 0.44–1.00)

## 2016-08-06 IMAGING — MR MR HEAD WO/W CM
11 series · 48 of 48 positions shown · IV contrast (multihance)
Comparison: None.

CLINICAL DATA: Facial twitching on the right   Hemi facial spasm.

EXAM:
MRI HEAD WITHOUT AND WITH CONTRAST
TECHNIQUE: Multiplanar, multiecho pulse sequences of the brain and surrounding
structures were obtained without and with intravenous contrast.
CONTRAST:  10mL MULTIHANCE GADOBENATE DIMEGLUMINE 529 MG/ML IV SOLN

[Series 4: DWI · axial · 3.0mm · 1.20mm/px · z∈[-61,+98]mm · 4 of 55 slices shown (1 of 2)]
[im 1/55]
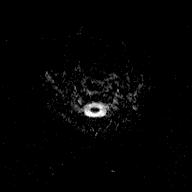
[im 19/55]
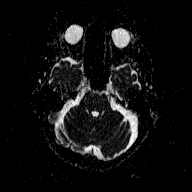
[im 37/55]
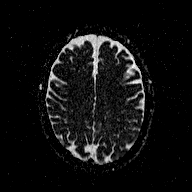
[im 55/55]
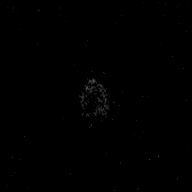

[Series 5: T2 · axial · 5.0mm · 0.72mm/px · z∈[-64,+101]mm · 2 of 25 slices shown (1 of 2)]
[im 1/25]
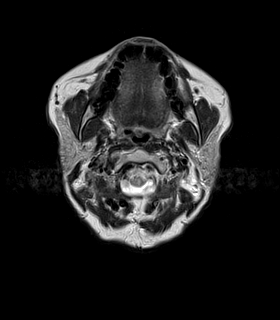
[im 25/25]
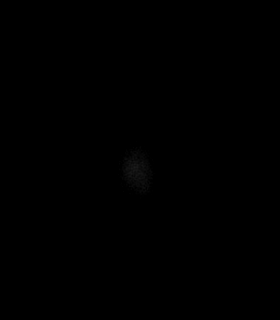

[Series 6: FLAIR · axial · 3.0mm · 0.45mm/px · z∈[-61,+98]mm · 4 of 55 slices shown]
[im 1/55]
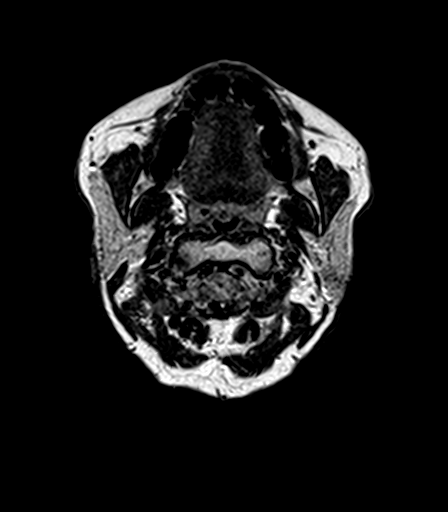
[im 19/55]
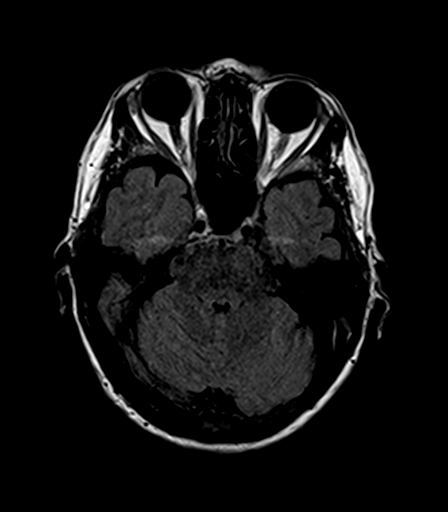
[im 37/55]
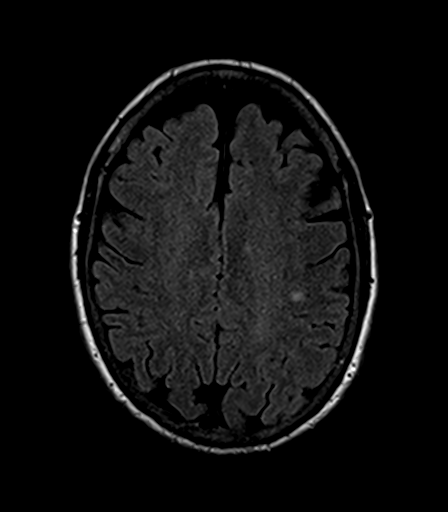
[im 55/55]
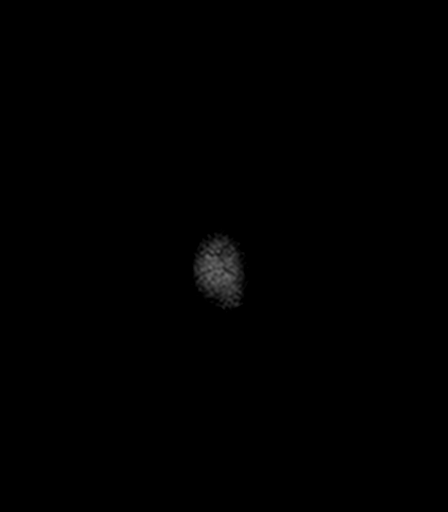

[Series 7: T2 · axial · 5.0mm · 0.72mm/px · z∈[-64,+101]mm · 2 of 25 slices shown (2 of 2)]
[im 1/25]
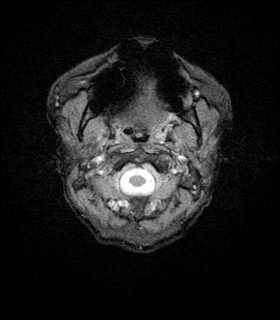
[im 25/25]
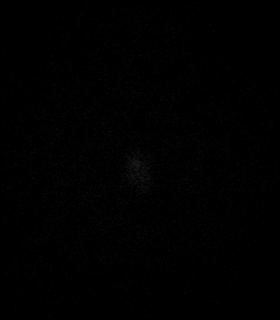

[Series 8: T1 · axial · 1.0mm · 1.00mm/px · z∈[-62,+94]mm · 12 of 160 slices shown (1 of 2)]
[im 1/160]
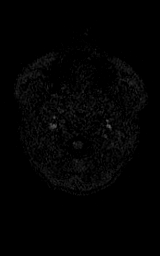
[im 15/160]
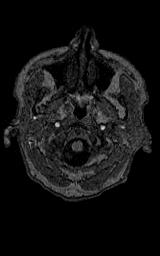
[im 29/160]
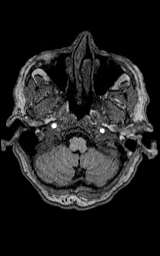
[im 44/160]
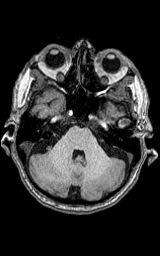
[im 58/160]
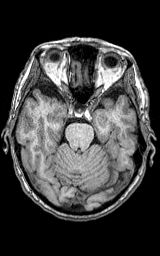
[im 73/160]
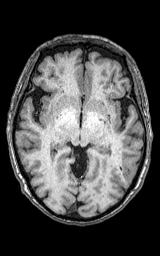
[im 87/160]
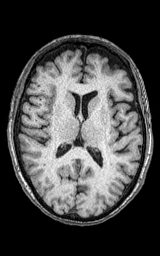
[im 102/160]
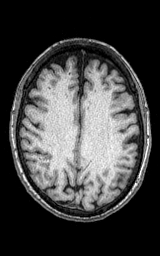
[im 116/160]
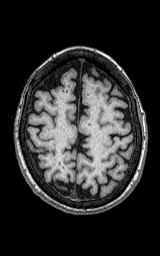
[im 131/160]
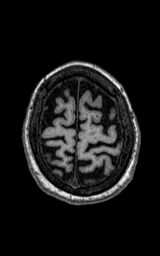
[im 145/160]
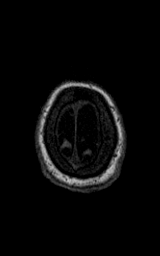
[im 160/160]
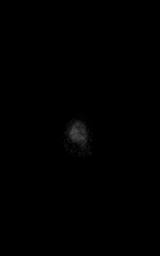

[Series 9: T1 · sagittal · 5.0mm · 0.45mm/px · 2 of 23 slices shown (2 of 2)]
[im 1/23]
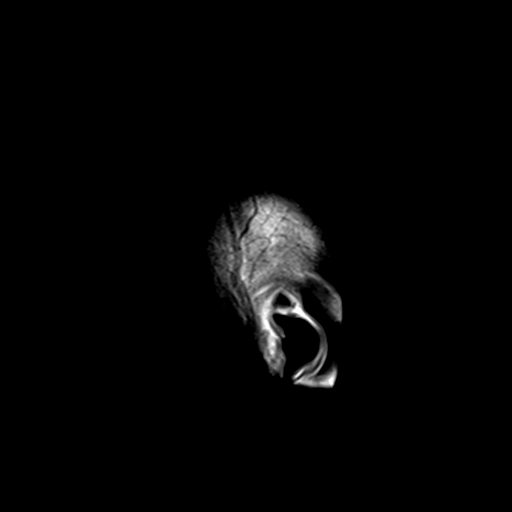
[im 23/23]
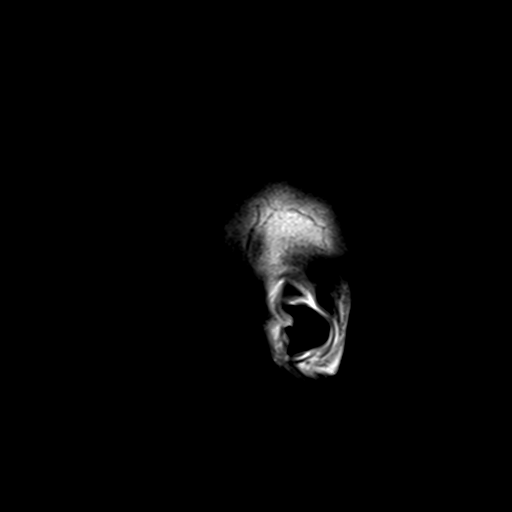

[Series 10: T2 post-contrast · coronal · 5.0mm · 0.45mm/px · 2 of 30 slices shown]
[im 1/30]
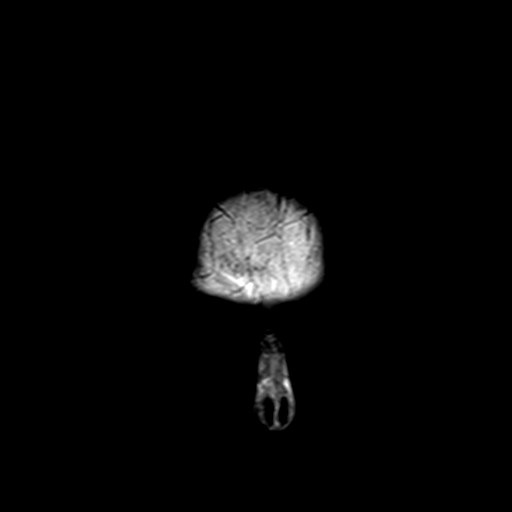
[im 30/30]
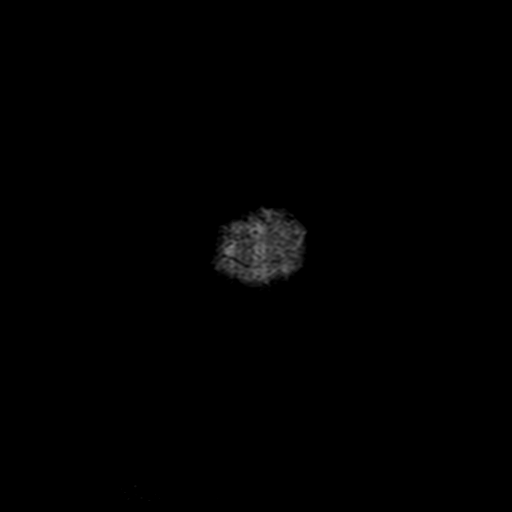

[Series 11: T1 post-contrast · axial · 1.0mm · 1.00mm/px · z∈[-62,+94]mm · 12 of 160 slices shown (1 of 3)]
[im 1/160]
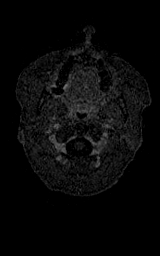
[im 15/160]
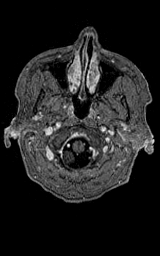
[im 29/160]
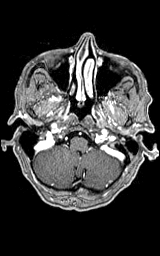
[im 44/160]
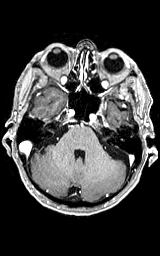
[im 58/160]
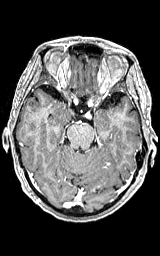
[im 73/160]
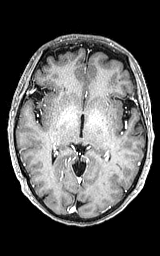
[im 87/160]
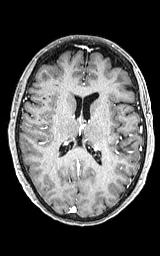
[im 102/160]
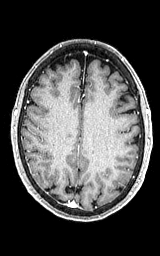
[im 116/160]
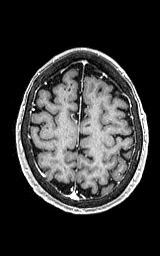
[im 131/160]
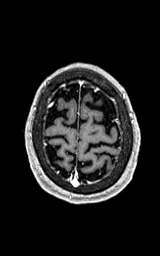
[im 145/160]
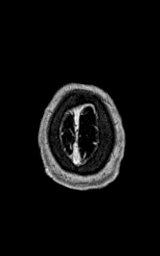
[im 160/160]
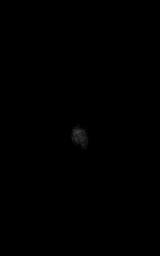

[Series 12: T1 post-contrast · coronal · 5.0mm · 0.45mm/px · 2 of 30 slices shown (2 of 3)]
[im 1/30]
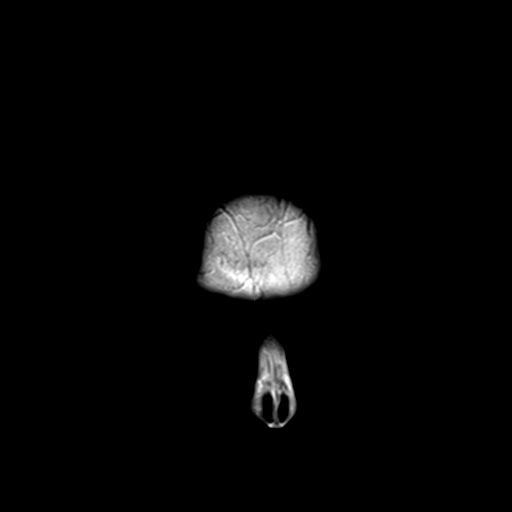
[im 30/30]
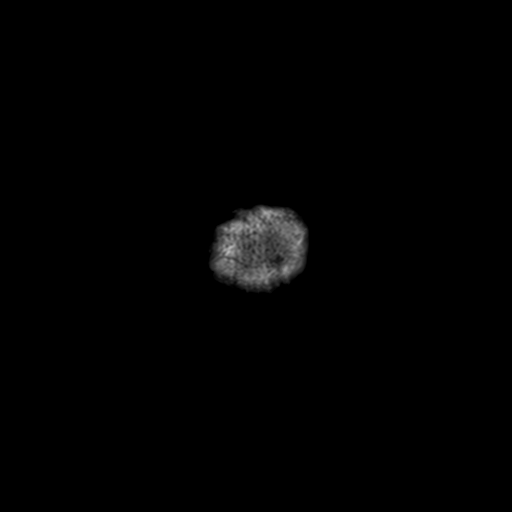

[Series 13: T1 post-contrast · sagittal · 5.0mm · 0.45mm/px · 2 of 23 slices shown (3 of 3)]
[im 1/23]
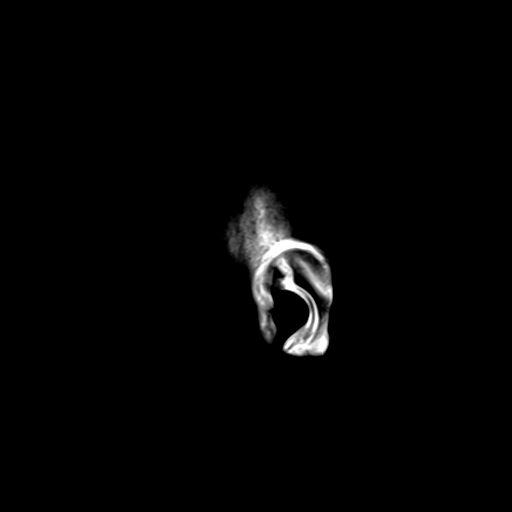
[im 23/23]
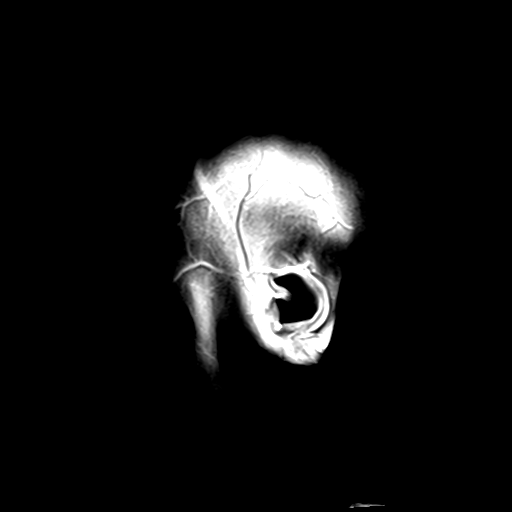

[Series 100: DWI · axial · 3.0mm · 1.20mm/px · z∈[-61,+98]mm · 4 of 55 slices shown (2 of 2)]
[im 1/55]
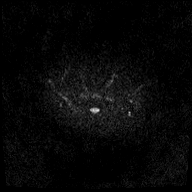
[im 19/55]
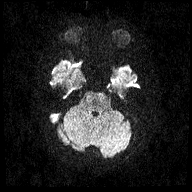
[im 37/55]
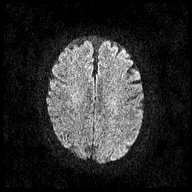
[im 55/55]
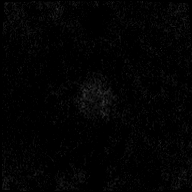

[48 of 48 positions shown; findings below may reference images not displayed]

FINDINGS: Brain: No evidence of mass or neuritis. Thin section images were not
obtained, but there is a visible loop of the right PICA near the
root entry of the facial nerve. Negative temporal bones.

Symmetric T1 hyperintensity in the globus pallidus and putamen

No acute or remote infarct, hemorrhage, or hydrocephalus. Few FLAIR
hyperintensities in the cerebral white matter, likely microvascular
ischemic in this patient with vascular risk factors.

Vascular: Major flow voids are preserved

Skull and upper cervical spine: Negative

Sinuses/Orbits: Negative
IMPRESSION: 1. The right PICA loops near the right facial nerve root entry zone.
2. T1 hyperintense of appearance of the basal ganglia may be from
senescent mineralization, but please ensure normal liver labs.

## 2016-08-06 MED ORDER — GADOBENATE DIMEGLUMINE 529 MG/ML IV SOLN
10.0000 mL | Freq: Once | INTRAVENOUS | Status: AC | PRN
  Administered 2016-08-06: 10 mL via INTRAVENOUS

## 2016-08-14 ENCOUNTER — Encounter: Payer: Self-pay | Admitting: *Deleted

## 2016-08-20 ENCOUNTER — Encounter: Payer: Self-pay | Admitting: *Deleted

## 2016-08-20 ENCOUNTER — Ambulatory Visit: Payer: Medicare PPO | Admitting: Anesthesiology

## 2016-08-20 ENCOUNTER — Ambulatory Visit
Admission: RE | Admit: 2016-08-20 | Discharge: 2016-08-20 | Disposition: A | Discharge status: Home / Self Care | Payer: Medicare PPO | Source: Ambulatory Visit | Attending: Ophthalmology | Admitting: Ophthalmology

## 2016-08-20 ENCOUNTER — Encounter
Admission: RE | Disposition: A | Discharge status: Home / Self Care | Payer: Self-pay | Source: Ambulatory Visit | Attending: Ophthalmology

## 2016-08-20 DIAGNOSIS — Z7984 Long term (current) use of oral hypoglycemic drugs: Secondary | ICD-10-CM | POA: Diagnosis not present

## 2016-08-20 DIAGNOSIS — M810 Age-related osteoporosis without current pathological fracture: Secondary | ICD-10-CM | POA: Diagnosis not present

## 2016-08-20 DIAGNOSIS — I10 Essential (primary) hypertension: Secondary | ICD-10-CM | POA: Diagnosis not present

## 2016-08-20 DIAGNOSIS — E7800 Pure hypercholesterolemia, unspecified: Secondary | ICD-10-CM | POA: Insufficient documentation

## 2016-08-20 DIAGNOSIS — M1990 Unspecified osteoarthritis, unspecified site: Secondary | ICD-10-CM | POA: Insufficient documentation

## 2016-08-20 DIAGNOSIS — Z7982 Long term (current) use of aspirin: Secondary | ICD-10-CM | POA: Diagnosis not present

## 2016-08-20 DIAGNOSIS — Z79899 Other long term (current) drug therapy: Secondary | ICD-10-CM | POA: Diagnosis not present

## 2016-08-20 DIAGNOSIS — H2512 Age-related nuclear cataract, left eye: Secondary | ICD-10-CM | POA: Insufficient documentation

## 2016-08-20 DIAGNOSIS — E559 Vitamin D deficiency, unspecified: Secondary | ICD-10-CM | POA: Insufficient documentation

## 2016-08-20 DIAGNOSIS — E119 Type 2 diabetes mellitus without complications: Secondary | ICD-10-CM | POA: Insufficient documentation

## 2016-08-20 DIAGNOSIS — Z853 Personal history of malignant neoplasm of breast: Secondary | ICD-10-CM | POA: Diagnosis not present

## 2016-08-20 DIAGNOSIS — Z9889 Other specified postprocedural states: Secondary | ICD-10-CM | POA: Diagnosis not present

## 2016-08-20 HISTORY — DX: Unspecified osteoarthritis, unspecified site: M19.90

## 2016-08-20 HISTORY — PX: CATARACT EXTRACTION W/PHACO: SHX586

## 2016-08-20 LAB — GLUCOSE, CAPILLARY: GLUCOSE-CAPILLARY: 208 mg/dL — AB (ref 65–99)

## 2016-08-20 SURGERY — PHACOEMULSIFICATION, CATARACT, WITH IOL INSERTION
Anesthesia: Monitor Anesthesia Care | Site: Eye | Laterality: Left | Wound class: Clean

## 2016-08-20 MED ORDER — SODIUM CHLORIDE 0.9 % IV SOLN
INTRAVENOUS | Status: DC
  Administered 2016-08-20: 07:00:00 via INTRAVENOUS

## 2016-08-20 MED ORDER — FENTANYL CITRATE (PF) 100 MCG/2ML IJ SOLN
INTRAMUSCULAR | Status: DC | PRN
  Administered 2016-08-20: 50 ug via INTRAVENOUS

## 2016-08-20 MED ORDER — ARMC OPHTHALMIC DILATING DROPS
1.0000 "application " | OPHTHALMIC | Status: AC
  Administered 2016-08-20 (×3): 1 via OPHTHALMIC

## 2016-08-20 MED ORDER — LIDOCAINE HCL (PF) 4 % IJ SOLN
INTRAOCULAR | Status: DC | PRN
  Administered 2016-08-20: 4 mL via OPHTHALMIC

## 2016-08-20 MED ORDER — MIDAZOLAM HCL 2 MG/2ML IJ SOLN
INTRAMUSCULAR | Status: AC
Start: 2016-08-20 — End: 2016-08-20
  Filled 2016-08-20: qty 2

## 2016-08-20 MED ORDER — CARBACHOL 0.01 % IO SOLN
INTRAOCULAR | Status: DC | PRN
  Administered 2016-08-20: 0.5 mL via INTRAOCULAR

## 2016-08-20 MED ORDER — NA CHONDROIT SULF-NA HYALURON 40-17 MG/ML IO SOLN
INTRAOCULAR | Status: DC | PRN
  Administered 2016-08-20: 1 mL via INTRAOCULAR

## 2016-08-20 MED ORDER — MOXIFLOXACIN HCL 0.5 % OP SOLN: 1.0000 [drp] | OPHTHALMIC | Status: DC | PRN

## 2016-08-20 MED ORDER — FENTANYL CITRATE (PF) 100 MCG/2ML IJ SOLN
INTRAMUSCULAR | Status: AC
  Filled 2016-08-20: qty 2

## 2016-08-20 MED ORDER — POVIDONE-IODINE 5 % OP SOLN
OPHTHALMIC | Status: DC | PRN
  Administered 2016-08-20: 1 via OPHTHALMIC

## 2016-08-20 MED ORDER — MIDAZOLAM HCL 2 MG/2ML IJ SOLN
INTRAMUSCULAR | Status: DC | PRN
  Administered 2016-08-20: 1 mg via INTRAVENOUS

## 2016-08-20 MED ORDER — EPINEPHRINE PF 1 MG/ML IJ SOLN
INTRAMUSCULAR | Status: DC | PRN
  Administered 2016-08-20: 08:00:00 via OPHTHALMIC

## 2016-08-20 MED ORDER — MOXIFLOXACIN HCL 0.5 % OP SOLN
OPHTHALMIC | Status: DC | PRN
  Administered 2016-08-20: 0.2 mL via OPHTHALMIC

## 2016-08-20 SURGICAL SUPPLY — 16 items
GLOVE BIO SURGEON STRL SZ8 (GLOVE) ×3 IMPLANT
GLOVE BIOGEL M 6.5 STRL (GLOVE) ×3 IMPLANT
GLOVE SURG LX 8.0 MICRO (GLOVE) ×2
GLOVE SURG LX STRL 8.0 MICRO (GLOVE) ×1 IMPLANT
GOWN STRL REUS W/ TWL LRG LVL3 (GOWN DISPOSABLE) ×2 IMPLANT
GOWN STRL REUS W/TWL LRG LVL3 (GOWN DISPOSABLE) ×4
LABEL CATARACT MEDS ST (LABEL) ×3 IMPLANT
LENS IOL TECNIS ITEC 25.0 (Intraocular Lens) ×3 IMPLANT
PACK CATARACT (MISCELLANEOUS) ×3 IMPLANT
PACK CATARACT BRASINGTON LX (MISCELLANEOUS) ×3 IMPLANT
PACK EYE AFTER SURG (MISCELLANEOUS) ×3 IMPLANT
SOL BSS BAG (MISCELLANEOUS) ×3
SOLUTION BSS BAG (MISCELLANEOUS) ×1 IMPLANT
SYR 5ML LL (SYRINGE) ×3 IMPLANT
WATER STERILE IRR 250ML POUR (IV SOLUTION) ×3 IMPLANT
WIPE NON LINTING 3.25X3.25 (MISCELLANEOUS) ×3 IMPLANT

## 2016-08-20 NOTE — Anesthesia Postprocedure Evaluation (Signed)
Anesthesia Post Note  Patient: Clarisse Rodriges Hedeen  Procedure(s) Performed: Procedure(s) (LRB): CATARACT EXTRACTION PHACO AND INTRAOCULAR LENS PLACEMENT (IOC) (Left)  Patient location during evaluation: PACU Anesthesia Type: MAC Level of consciousness: awake and alert Pain management: pain level controlled Vital Signs Assessment: post-procedure vital signs reviewed and stable Respiratory status: spontaneous breathing, nonlabored ventilation, respiratory function stable and patient connected to nasal cannula oxygen Cardiovascular status: stable and blood pressure returned to baseline Anesthetic complications: no     Last Vitals:  Vitals:   08/20/16 0647 08/20/16 0815  BP: (!) 121/51 (!) 116/39  Pulse: 84 87  Resp: 18 (!) 21  Temp: 36.6 C     Last Pain:  Vitals:   08/20/16 0815  TempSrc: Tympanic                 Darlyne Russian

## 2016-08-20 NOTE — H&P (Signed)
All labs reviewed. Abnormal studies sent to patients PCP when indicated.  Previous H&P reviewed, patient examined, there are NO CHANGES.  Terri Harris [name]

## 2016-08-20 NOTE — Anesthesia Procedure Notes (Signed)
Procedure Name: MAC Date/Time: 08/20/2016 7:51 AM Performed by: Darlyne Russian Pre-anesthesia Checklist: Patient identified, Emergency Drugs available, Suction available, Patient being monitored and Timeout performed Oxygen Delivery Method: Nasal cannula Placement Confirmation: positive ETCO2

## 2016-08-20 NOTE — Op Note (Signed)
PREOPERATIVE DIAGNOSIS:  Nuclear sclerotic cataract of the left eye.   POSTOPERATIVE DIAGNOSIS:  Nuclear sclerotic cataract of the left eye.   OPERATIVE PROCEDURE: Procedure(s): CATARACT EXTRACTION PHACO AND INTRAOCULAR LENS PLACEMENT (IOC)   SURGEON:  Birder Robson, MD.   ANESTHESIA:  Anesthesiologist: Emmie Niemann, MD CRNA: Darlyne Russian, CRNA  1.      Managed anesthesia care. 2.     0.73ml of Shugarcaine was instilled following the paracentesis   COMPLICATIONS:  None.   TECHNIQUE:   Stop and chop   DESCRIPTION OF PROCEDURE:  The patient was examined and consented in the preoperative holding area where the aforementioned topical anesthesia was applied to the left eye and then brought back to the Operating Room where the left eye was prepped and draped in the usual sterile ophthalmic fashion and a lid speculum was placed. A paracentesis was created with the side port blade and the anterior chamber was filled with viscoelastic. A near clear corneal incision was performed with the steel keratome. A continuous curvilinear capsulorrhexis was performed with a cystotome followed by the capsulorrhexis forceps. Hydrodissection and hydrodelineation were carried out with BSS on a blunt cannula. The lens was removed in a stop and chop  technique and the remaining cortical material was removed with the irrigation-aspiration handpiece. The capsular bag was inflated with viscoelastic and the Technis [id number] lens was placed in the capsular bag without complication. The remaining viscoelastic was removed from the eye with the irrigation-aspiration handpiece. The wounds were hydrated. The anterior chamber was flushed with Miostat and the eye was inflated to physiologic pressure. 0.16ml Vigamox was placed in the anterior chamber. The wounds were found to be water tight. The eye was dressed with Vigamox. The patient was given protective glasses to wear throughout the day and a shield with which to sleep  tonight. The patient was also given drops with which to begin a drop regimen today and will follow-up with me in one day.  Implant Name Type Inv. Item Serial No. Manufacturer Lot No. LRB No. Used  LENS IOL DIOP 25.0 - [id number] Intraocular Lens LENS IOL DIOP 25.0 [id number]   Left 1    Procedure(s) with comments: CATARACT EXTRACTION PHACO AND INTRAOCULAR LENS PLACEMENT (IOC) (Left) - [place] 01:11.3 AP% 20.4 CDE 14.58 FLUID PACK LOT # [id number]  Electronically signed: Tim Lair 08/20/2016 8:13 AM

## 2016-08-20 NOTE — Discharge Instructions (Signed)
Eye Surgery Discharge Instructions  Expect mild scratchy sensation or mild soreness. DO NOT RUB YOUR EYE!  The day of surgery:  Minimal physical activity, but bed rest is not required  No reading, computer work, or close hand work  No bending, lifting, or straining.  May watch TV  For 24 hours:  No driving, legal decisions, or alcoholic beverages  Safety precautions  Eat anything you prefer: It is better to start with liquids, then soup then solid foods.  _____ Eye patch should be worn until postoperative exam tomorrow.  ____ Solar shield eyeglasses should be worn for comfort in the sunlight/patch while sleeping  Resume all regular medications including aspirin or Coumadin if these were discontinued prior to surgery. You may shower, bathe, shave, or wash your hair. Tylenol may be taken for mild discomfort.  Call your doctor if you experience significant pain, nausea, or vomiting, fever > 101 or other signs of infection. [phone] or [phone] Specific instructions:  Follow-up Information    Birder Robson, MD Follow up.   Specialty:  Ophthalmology Why:  July 25 at 9:25am Contact information: [address] [place] [address] [phone]

## 2016-08-20 NOTE — Anesthesia Post-op Follow-up Note (Cosign Needed)
Anesthesia QCDR form completed.        

## 2016-08-20 NOTE — Transfer of Care (Signed)
Immediate Anesthesia Transfer of Care Note  Patient: Terri Collins  Procedure(s) Performed: Procedure(s) with comments: CATARACT EXTRACTION PHACO AND INTRAOCULAR LENS PLACEMENT (IOC) (Left) - [place] 01:11.3 AP% 20.4 CDE 14.58 FLUID PACK LOT # [id number]  Patient Location: PACU  Anesthesia Type:MAC  Level of Consciousness: awake, alert  and oriented  Airway & Oxygen Therapy: Patient Spontanous Breathing  Post-op Assessment: Report given to RN and Post -op Vital signs reviewed and stable  Post vital signs: Reviewed and stable  Last Vitals:  Vitals:   08/20/16 0647 08/20/16 0815  BP: (!) 121/51 (!) 116/39  Pulse: 84 87  Resp: 18 (!) 21  Temp: 36.6 C     Last Pain:  Vitals:   08/20/16 0815  TempSrc: Tympanic         Complications: No apparent anesthesia complications

## 2016-08-20 NOTE — Anesthesia Preprocedure Evaluation (Addendum)
Anesthesia Evaluation  Patient identified by MRN, date of birth, ID band Patient awake    Reviewed: Allergy & Precautions, NPO status , Patient's Chart, lab work & pertinent test results  History of Anesthesia Complications Negative for: history of anesthetic complications  Airway Mallampati: II  TM Distance: >3 FB Neck ROM: Full    Dental no notable dental hx.    Pulmonary neg pulmonary ROS, neg sleep apnea, neg COPD,    breath sounds clear to auscultation- rhonchi (-) wheezing      Cardiovascular hypertension, Pt. on medications (-) CAD, (-) Past MI and (-) Cardiac Stents  Rhythm:Regular Rate:Normal - Systolic murmurs and - Diastolic murmurs    Neuro/Psych PSYCHIATRIC DISORDERS Depression negative neurological ROS     GI/Hepatic negative GI ROS, Neg liver ROS,   Endo/Other  diabetes, Oral Hypoglycemic Agents  Renal/GU negative Renal ROS     Musculoskeletal  (+) Arthritis ,   Abdominal (+) - obese,   Peds  Hematology  (+) anemia ,   Anesthesia Other Findings Past Medical History: No date: Anemia No date: Arthritis 1999: Cancer (HCC)     Comment:  right breast No date: Depression No date: Diabetes mellitus without complication (HCC) No date: Hyperlipidemia No date: Hypertension No date: Osteoporosis No date: Vitamin D deficiency   Reproductive/Obstetrics                             Anesthesia Physical  Anesthesia Plan  ASA: II  Anesthesia Plan: MAC   Post-op Pain Management:    Induction: Intravenous  PONV Risk Score and Plan: 2 and Midazolam  Airway Management Planned: Natural Airway  Additional Equipment:   Intra-op Plan:   Post-operative Plan:   Informed Consent: I have reviewed the patients History and Physical, chart, labs and discussed the procedure including the risks, benefits and alternatives for the proposed anesthesia with the patient or authorized  representative who has indicated his/her understanding and acceptance.     Plan Discussed with: CRNA and Anesthesiologist  Anesthesia Plan Comments:         Anesthesia Quick Evaluation  

## 2016-08-26 ENCOUNTER — Other Ambulatory Visit: Payer: Self-pay | Admitting: Family Medicine

## 2016-08-26 DIAGNOSIS — Z1231 Encounter for screening mammogram for malignant neoplasm of breast: Secondary | ICD-10-CM

## 2016-09-25 ENCOUNTER — Encounter: Payer: Self-pay | Admitting: *Deleted

## 2016-10-01 ENCOUNTER — Ambulatory Visit
Admission: RE | Admit: 2016-10-01 | Discharge: 2016-10-01 | Disposition: A | Discharge status: Home / Self Care | Payer: Medicare PPO | Source: Ambulatory Visit | Attending: Family Medicine | Admitting: Family Medicine

## 2016-10-01 DIAGNOSIS — Z1231 Encounter for screening mammogram for malignant neoplasm of breast: Secondary | ICD-10-CM | POA: Insufficient documentation

## 2016-10-01 IMAGING — MG MM DIGITAL SCREENING UNILAT*L* W/ TOMO W/ CAD
6 series · 6 of 14 positions shown · non-contrast
Comparison: Previous exam(s).

CLINICAL DATA: Screening.

EXAM:
2D DIGITAL SCREENING UNILATERAL LEFT MAMMOGRAM WITH CAD AND ADJUNCT
TOMO

[L CC synth-2D]
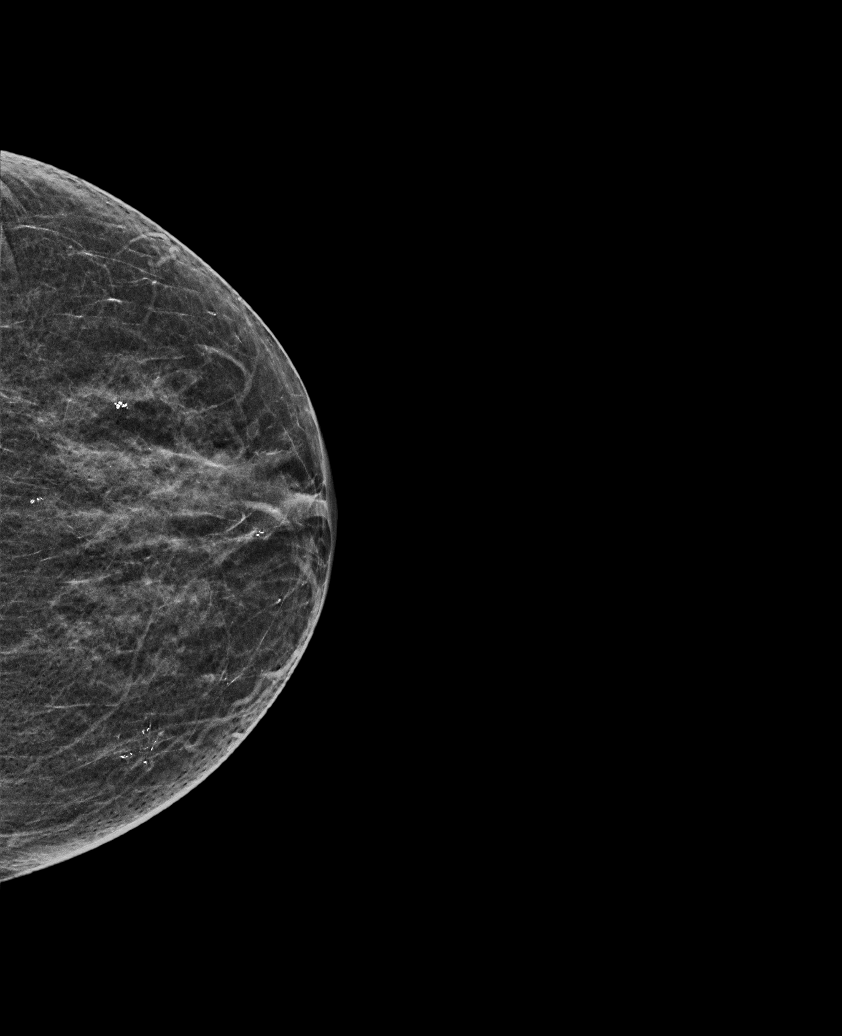

[L CC]
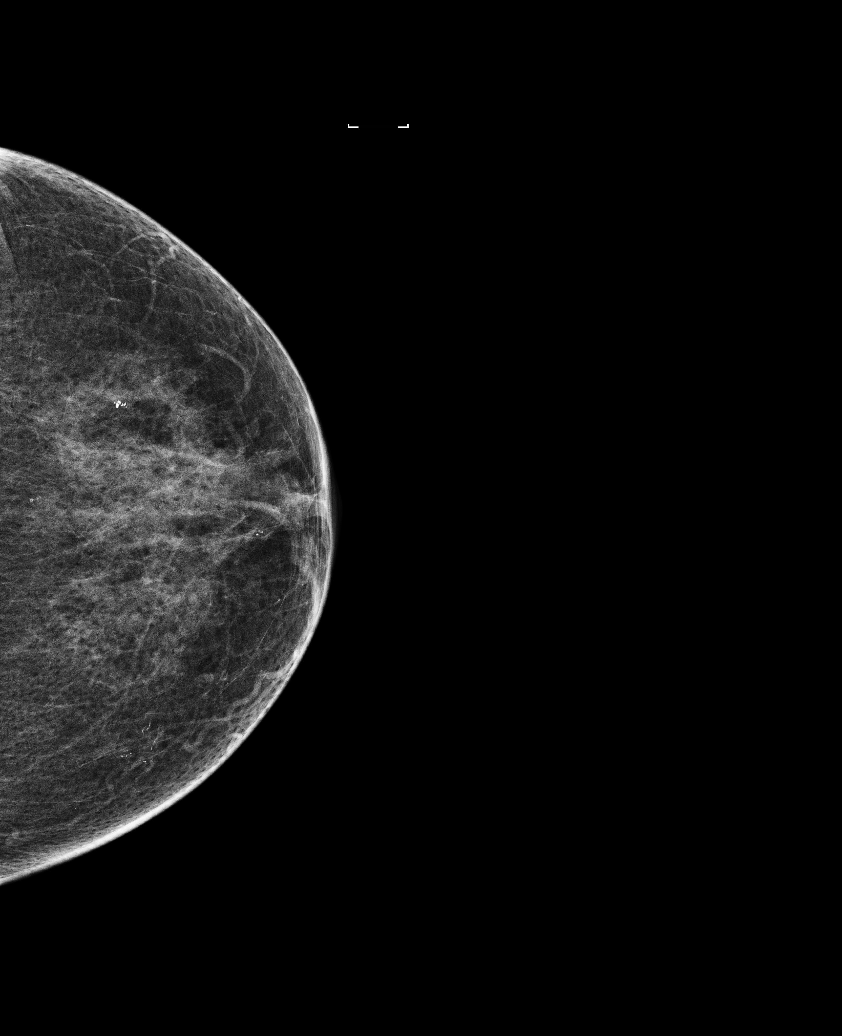

[L MLO]
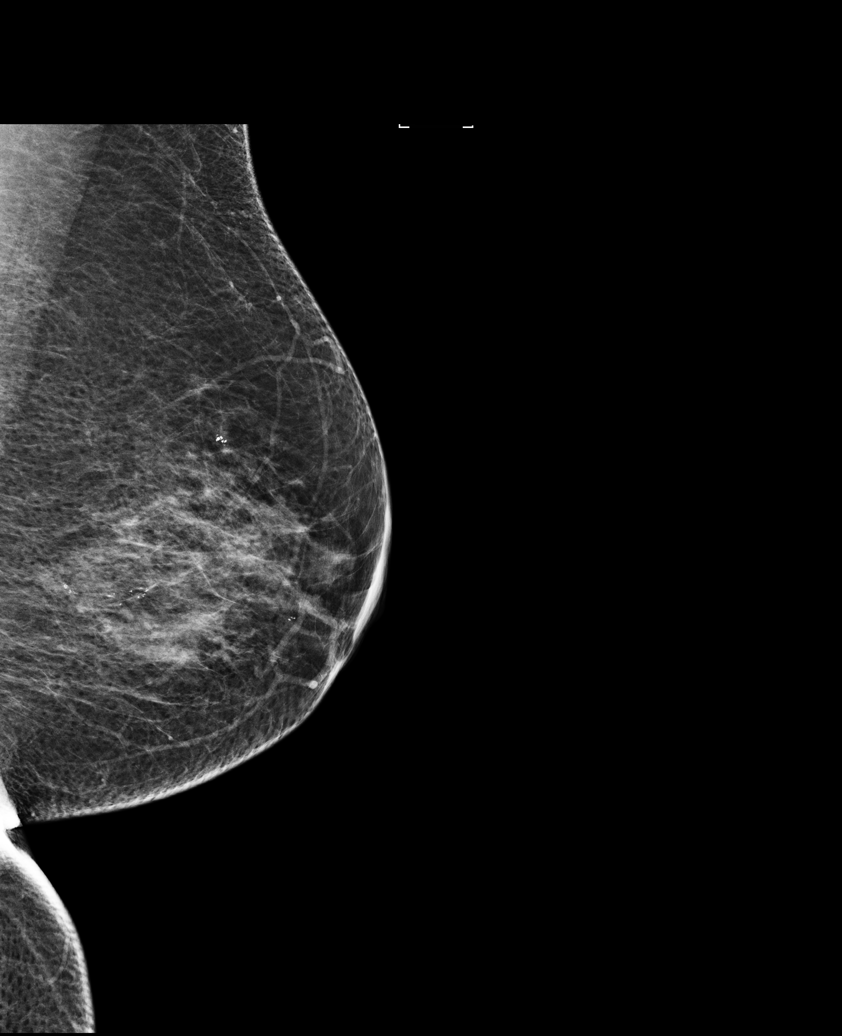

[L MLO synth-2D]
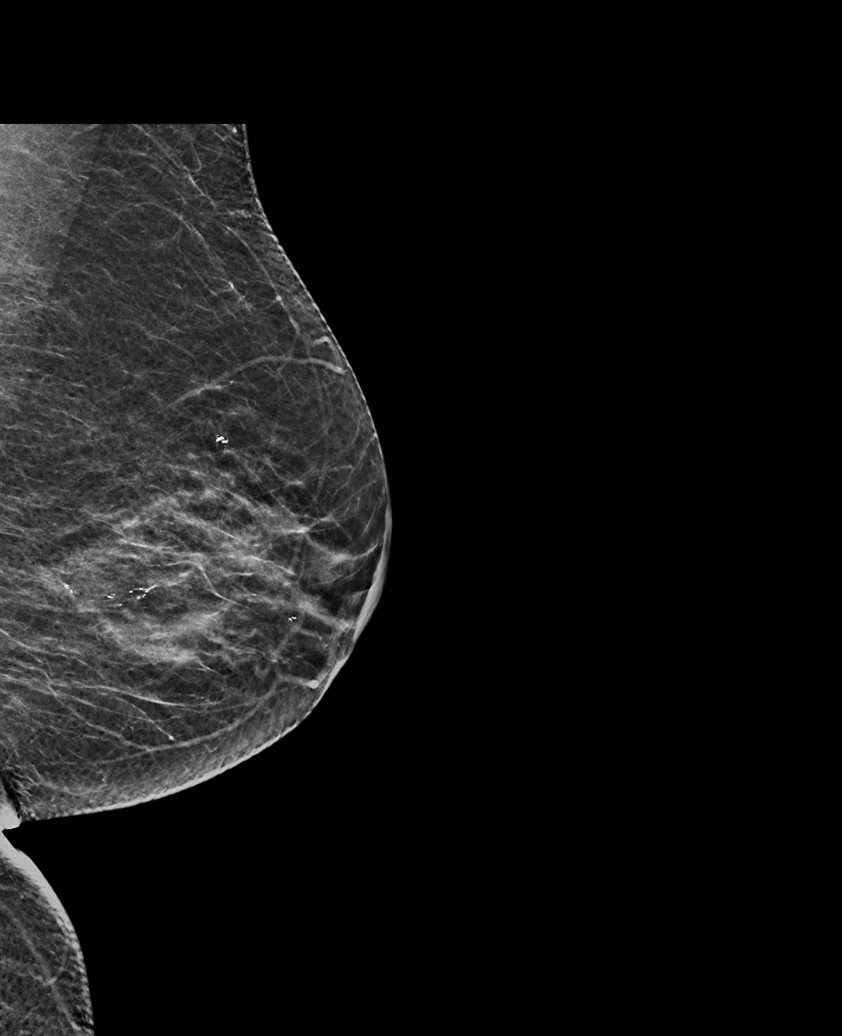

[L CC tomo · tomo slice 24/47.0]
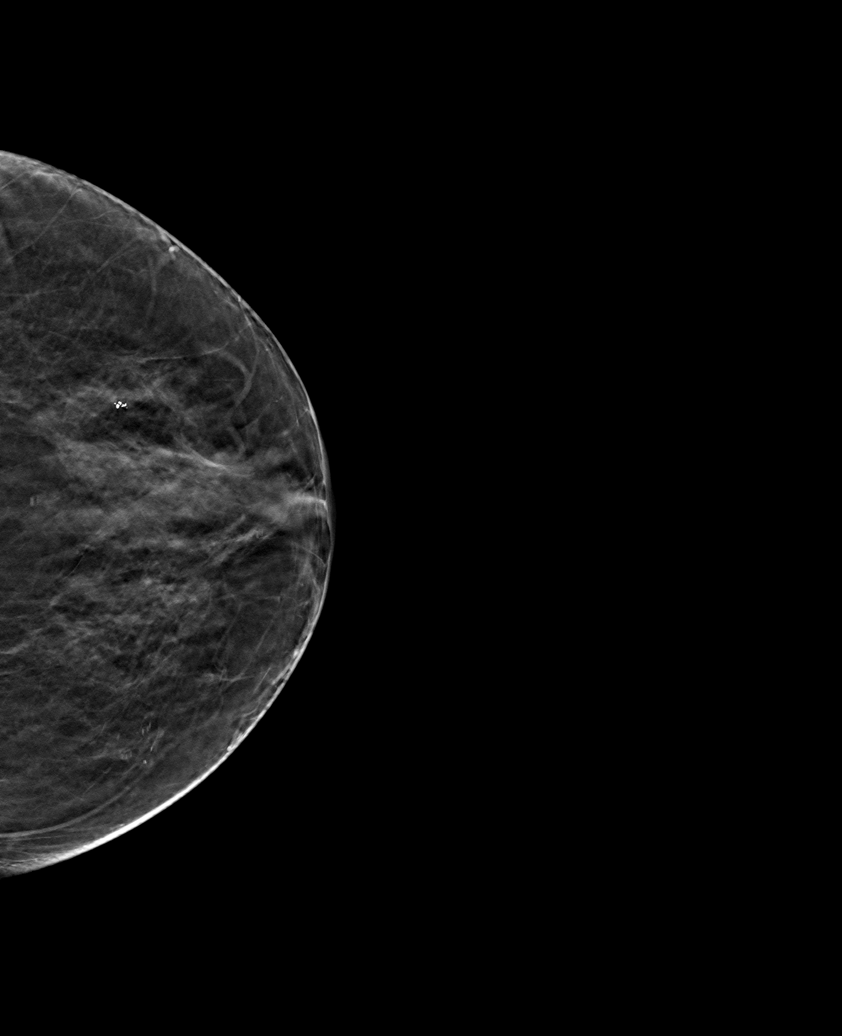

[L MLO tomo · tomo slice 27/54.0]
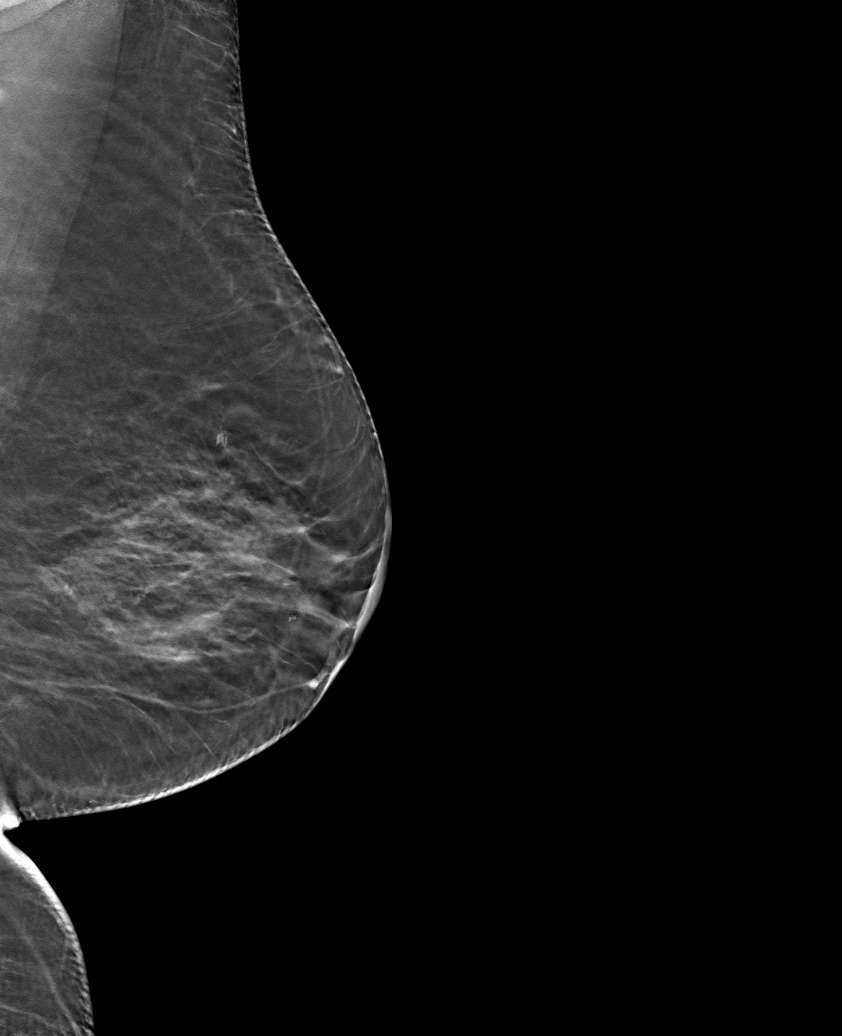

[6 of 14 positions shown; findings below may reference images not displayed]

ACR Breast Density Category b: There are scattered areas of
fibroglandular density.
FINDINGS: There are no findings suspicious for malignancy. Images were
processed with CAD.
IMPRESSION: No mammographic evidence of malignancy. A result letter of this
screening mammogram will be mailed directly to the patient.

RECOMMENDATION:
Screening mammogram in one year. (Code:[id number]-[id number])

BI-RADS CATEGORY  1: Negative.

## 2016-10-08 ENCOUNTER — Encounter
Admission: RE | Disposition: A | Discharge status: Home / Self Care | Payer: Self-pay | Source: Ambulatory Visit | Attending: Ophthalmology

## 2016-10-08 ENCOUNTER — Ambulatory Visit: Payer: Medicare PPO | Admitting: Certified Registered Nurse Anesthetist

## 2016-10-08 ENCOUNTER — Ambulatory Visit
Admission: RE | Admit: 2016-10-08 | Discharge: 2016-10-08 | Disposition: A | Discharge status: Home / Self Care | Payer: Medicare PPO | Source: Ambulatory Visit | Attending: Ophthalmology | Admitting: Ophthalmology

## 2016-10-08 ENCOUNTER — Encounter: Payer: Self-pay | Admitting: *Deleted

## 2016-10-08 DIAGNOSIS — Z7984 Long term (current) use of oral hypoglycemic drugs: Secondary | ICD-10-CM | POA: Diagnosis not present

## 2016-10-08 DIAGNOSIS — E119 Type 2 diabetes mellitus without complications: Secondary | ICD-10-CM | POA: Insufficient documentation

## 2016-10-08 DIAGNOSIS — H2511 Age-related nuclear cataract, right eye: Secondary | ICD-10-CM | POA: Insufficient documentation

## 2016-10-08 DIAGNOSIS — E559 Vitamin D deficiency, unspecified: Secondary | ICD-10-CM | POA: Insufficient documentation

## 2016-10-08 DIAGNOSIS — F329 Major depressive disorder, single episode, unspecified: Secondary | ICD-10-CM | POA: Diagnosis not present

## 2016-10-08 DIAGNOSIS — E7800 Pure hypercholesterolemia, unspecified: Secondary | ICD-10-CM | POA: Insufficient documentation

## 2016-10-08 DIAGNOSIS — Z7983 Long term (current) use of bisphosphonates: Secondary | ICD-10-CM | POA: Diagnosis not present

## 2016-10-08 DIAGNOSIS — M810 Age-related osteoporosis without current pathological fracture: Secondary | ICD-10-CM | POA: Diagnosis not present

## 2016-10-08 DIAGNOSIS — E785 Hyperlipidemia, unspecified: Secondary | ICD-10-CM | POA: Diagnosis not present

## 2016-10-08 DIAGNOSIS — I10 Essential (primary) hypertension: Secondary | ICD-10-CM | POA: Diagnosis not present

## 2016-10-08 DIAGNOSIS — M1990 Unspecified osteoarthritis, unspecified site: Secondary | ICD-10-CM | POA: Insufficient documentation

## 2016-10-08 DIAGNOSIS — Z79899 Other long term (current) drug therapy: Secondary | ICD-10-CM | POA: Insufficient documentation

## 2016-10-08 DIAGNOSIS — Z853 Personal history of malignant neoplasm of breast: Secondary | ICD-10-CM | POA: Insufficient documentation

## 2016-10-08 HISTORY — PX: CATARACT EXTRACTION W/PHACO: SHX586

## 2016-10-08 HISTORY — DX: Dizziness and giddiness: R42

## 2016-10-08 LAB — GLUCOSE, CAPILLARY: GLUCOSE-CAPILLARY: 179 mg/dL — AB (ref 65–99)

## 2016-10-08 SURGERY — PHACOEMULSIFICATION, CATARACT, WITH IOL INSERTION
Anesthesia: Monitor Anesthesia Care | Site: Eye | Laterality: Right | Wound class: Clean

## 2016-10-08 MED ORDER — SODIUM CHLORIDE 0.9 % IV SOLN
INTRAVENOUS | Status: DC
  Administered 2016-10-08: 07:00:00 via INTRAVENOUS

## 2016-10-08 MED ORDER — MIDAZOLAM HCL 2 MG/2ML IJ SOLN
INTRAMUSCULAR | Status: AC
  Filled 2016-10-08: qty 2

## 2016-10-08 MED ORDER — POVIDONE-IODINE 5 % OP SOLN
OPHTHALMIC | Status: DC | PRN
  Administered 2016-10-08: 1 via OPHTHALMIC

## 2016-10-08 MED ORDER — EPINEPHRINE PF 1 MG/ML IJ SOLN
INTRAMUSCULAR | Status: AC
  Filled 2016-10-08: qty 1

## 2016-10-08 MED ORDER — ARMC OPHTHALMIC DILATING DROPS
OPHTHALMIC | Status: AC
  Administered 2016-10-08: 1 via OPHTHALMIC
  Filled 2016-10-08: qty 0.4

## 2016-10-08 MED ORDER — MOXIFLOXACIN HCL 0.5 % OP SOLN
OPHTHALMIC | Status: AC
  Filled 2016-10-08: qty 3

## 2016-10-08 MED ORDER — MOXIFLOXACIN HCL 0.5 % OP SOLN: 1.0000 [drp] | OPHTHALMIC | Status: DC | PRN

## 2016-10-08 MED ORDER — FENTANYL CITRATE (PF) 100 MCG/2ML IJ SOLN
INTRAMUSCULAR | Status: AC
  Filled 2016-10-08: qty 2

## 2016-10-08 MED ORDER — MIDAZOLAM HCL 2 MG/2ML IJ SOLN
INTRAMUSCULAR | Status: DC | PRN
  Administered 2016-10-08: 1 mg via INTRAVENOUS

## 2016-10-08 MED ORDER — NA CHONDROIT SULF-NA HYALURON 40-17 MG/ML IO SOLN
INTRAOCULAR | Status: AC
  Filled 2016-10-08: qty 1

## 2016-10-08 MED ORDER — CARBACHOL 0.01 % IO SOLN
INTRAOCULAR | Status: DC | PRN
  Administered 2016-10-08: .5 mL via INTRAOCULAR

## 2016-10-08 MED ORDER — MOXIFLOXACIN HCL 0.5 % OP SOLN
OPHTHALMIC | Status: DC | PRN
  Administered 2016-10-08: .2 mL via OPHTHALMIC

## 2016-10-08 MED ORDER — POVIDONE-IODINE 5 % OP SOLN
OPHTHALMIC | Status: AC
  Filled 2016-10-08: qty 30

## 2016-10-08 MED ORDER — EPINEPHRINE PF 1 MG/ML IJ SOLN
INTRAMUSCULAR | Status: DC | PRN
  Administered 2016-10-08: 1 mL via OPHTHALMIC

## 2016-10-08 MED ORDER — NA CHONDROIT SULF-NA HYALURON 40-17 MG/ML IO SOLN
INTRAOCULAR | Status: DC | PRN
  Administered 2016-10-08: 1 mL via INTRAOCULAR

## 2016-10-08 MED ORDER — LIDOCAINE HCL (PF) 4 % IJ SOLN
INTRAMUSCULAR | Status: AC
  Filled 2016-10-08: qty 5

## 2016-10-08 MED ORDER — FENTANYL CITRATE (PF) 100 MCG/2ML IJ SOLN
INTRAMUSCULAR | Status: DC | PRN
  Administered 2016-10-08: 25 ug via INTRAVENOUS

## 2016-10-08 MED ORDER — LIDOCAINE HCL (PF) 4 % IJ SOLN
INTRAOCULAR | Status: DC | PRN
  Administered 2016-10-08: 2 mL via OPHTHALMIC

## 2016-10-08 MED ORDER — ARMC OPHTHALMIC DILATING DROPS
1.0000 "application " | OPHTHALMIC | Status: AC
  Administered 2016-10-08 (×3): 1 via OPHTHALMIC

## 2016-10-08 MED ORDER — BUPIVACAINE HCL (PF) 0.75 % IJ SOLN
INTRAMUSCULAR | Status: AC
  Filled 2016-10-08: qty 10

## 2016-10-08 SURGICAL SUPPLY — 16 items
GLOVE BIO SURGEON STRL SZ8 (GLOVE) ×3 IMPLANT
GLOVE BIOGEL M 6.5 STRL (GLOVE) ×3 IMPLANT
GLOVE SURG LX 8.0 MICRO (GLOVE) ×2
GLOVE SURG LX STRL 8.0 MICRO (GLOVE) ×1 IMPLANT
GOWN STRL REUS W/ TWL LRG LVL3 (GOWN DISPOSABLE) ×2 IMPLANT
GOWN STRL REUS W/TWL LRG LVL3 (GOWN DISPOSABLE) ×4
LABEL CATARACT MEDS ST (LABEL) ×3 IMPLANT
LENS IOL TECNIS ITEC 25.0 (Intraocular Lens) ×3 IMPLANT
PACK CATARACT (MISCELLANEOUS) ×3 IMPLANT
PACK CATARACT BRASINGTON LX (MISCELLANEOUS) ×3 IMPLANT
PACK EYE AFTER SURG (MISCELLANEOUS) ×3 IMPLANT
SOL BSS BAG (MISCELLANEOUS) ×3
SOLUTION BSS BAG (MISCELLANEOUS) ×1 IMPLANT
SYR 5ML LL (SYRINGE) ×3 IMPLANT
WATER STERILE IRR 250ML POUR (IV SOLUTION) ×3 IMPLANT
WIPE NON LINTING 3.25X3.25 (MISCELLANEOUS) ×3 IMPLANT

## 2016-10-08 NOTE — Transfer of Care (Signed)
Immediate Anesthesia Transfer of Care Note  Patient: Terri Collins  Procedure(s) Performed: Procedure(s) with comments: CATARACT EXTRACTION PHACO AND INTRAOCULAR LENS PLACEMENT ([place]) (Right) - [place]  01:10 AP% 19.5 CDE 13.70 fluid pack lot # [id number]  Patient Location: PACU  Anesthesia Type:MAC  Level of Consciousness: awake, alert , oriented and patient cooperative  Airway & Oxygen Therapy: Patient Spontanous Breathing  Post-op Assessment: Report given to RN and Post -op Vital signs reviewed and stable  Post vital signs: Reviewed and stable  Last Vitals:  Vitals:   10/08/16 0615 10/08/16 0752  BP: (!) 142/62 (!) 122/59  Pulse: 80 83  Resp: 20 16  Temp: 36.8 C   SpO2: 100% 98%    Last Pain:  Vitals:   10/08/16 0752  TempSrc: Oral      Patients Stated Pain Goal: 0 (76/54/65 0354)  Complications: No apparent anesthesia complications

## 2016-10-08 NOTE — Discharge Instructions (Signed)
Eye Surgery Discharge Instructions  Expect mild scratchy sensation or mild soreness. DO NOT RUB YOUR EYE!  The day of surgery:  Minimal physical activity, but bed rest is not required  No reading, computer work, or close hand work  No bending, lifting, or straining.  May watch TV  For 24 hours:  No driving, legal decisions, or alcoholic beverages  Safety precautions  Eat anything you prefer: It is better to start with liquids, then soup then solid foods.  _____ Eye patch should be worn until postoperative exam tomorrow.  ____ Solar shield eyeglasses should be worn for comfort in the sunlight/patch while sleeping  Resume all regular medications including aspirin or Coumadin if these were discontinued prior to surgery. You may shower, bathe, shave, or wash your hair. Tylenol may be taken for mild discomfort.  Call your doctor if you experience significant pain, nausea, or vomiting, fever > 101 or other signs of infection. [phone] or [phone] Specific instructions:  Follow-up Information    Birder Robson, MD Follow up on 10/09/2016.   Specialty:  Ophthalmology Why:  10:05 Contact information: [address] [place] [address] [phone]

## 2016-10-08 NOTE — Anesthesia Procedure Notes (Signed)
Procedure Name: MAC Date/Time: 08/24/2016 7:33 AM Performed by: Darlyne Russian Pre-anesthesia Checklist: Patient identified, Emergency Drugs available, Suction available, Patient being monitored and Timeout performed Patient Re-evaluated:Patient Re-evaluated prior to induction Oxygen Delivery Method: Nasal cannula Placement Confirmation: positive ETCO2

## 2016-10-08 NOTE — Anesthesia Postprocedure Evaluation (Signed)
Anesthesia Post Note  Patient: Terri Collins  Procedure(s) Performed: Procedure(s) (LRB): CATARACT EXTRACTION PHACO AND INTRAOCULAR LENS PLACEMENT (IOC) (Right)  Patient location during evaluation: PACU Anesthesia Type: MAC Level of consciousness: awake and alert Pain management: pain level controlled Vital Signs Assessment: post-procedure vital signs reviewed and stable Respiratory status: spontaneous breathing, nonlabored ventilation, respiratory function stable and patient connected to nasal cannula oxygen Cardiovascular status: stable and blood pressure returned to baseline Anesthetic complications: no     Last Vitals:  Vitals:   10/08/16 0615 10/08/16 0752  BP: (!) 142/62 (!) 122/59  Pulse: 80 83  Resp: 20 16  Temp: 36.8 C   SpO2: 100% 98%    Last Pain:  Vitals:   10/08/16 0752  TempSrc: Oral                 Darlyne Russian

## 2016-10-08 NOTE — Op Note (Signed)
PREOPERATIVE DIAGNOSIS:  Nuclear sclerotic cataract of the right eye.   POSTOPERATIVE DIAGNOSIS:  nuclear sclerotic cataract right eye   OPERATIVE PROCEDURE: Procedure(s): CATARACT EXTRACTION PHACO AND INTRAOCULAR LENS PLACEMENT (IOC)   SURGEON:  Birder Robson, MD.   ANESTHESIA:  Anesthesiologist: Martha Clan, MD CRNA: Darlyne Russian, CRNA  1.      Managed anesthesia care. 2.      0.81ml of Shugarcaine was instilled in the eye following the paracentesis.   COMPLICATIONS:  None.   TECHNIQUE:   Stop and chop   DESCRIPTION OF PROCEDURE:  The patient was examined and consented in the preoperative holding area where the aforementioned topical anesthesia was applied to the right eye and then brought back to the Operating Room where the right eye was prepped and draped in the usual sterile ophthalmic fashion and a lid speculum was placed. A paracentesis was created with the side port blade and the anterior chamber was filled with viscoelastic. A near clear corneal incision was performed with the steel keratome. A continuous curvilinear capsulorrhexis was performed with a cystotome followed by the capsulorrhexis forceps. Hydrodissection and hydrodelineation were carried out with BSS on a blunt cannula. The lens was removed in a stop and chop  technique and the remaining cortical material was removed with the irrigation-aspiration handpiece. The capsular bag was inflated with viscoelastic and the Technis ZCB00  lens was placed in the capsular bag without complication. The remaining viscoelastic was removed from the eye with the irrigation-aspiration handpiece. The wounds were hydrated. The anterior chamber was flushed with Miostat and the eye was inflated to physiologic pressure. 0.72ml of Vigamox was placed in the anterior chamber. The wounds were found to be water tight. The eye was dressed with Vigamox. The patient was given protective glasses to wear throughout the day and a shield with which to  sleep tonight. The patient was also given drops with which to begin a drop regimen today and will follow-up with me in one day.  Implant Name Type Inv. Item Serial No. Manufacturer Lot No. LRB No. Used  LENS IOL DIOP 25.0 - [id number] Intraocular Lens LENS IOL DIOP 25.0 [id number]   Right 1   Procedure(s) with comments: CATARACT EXTRACTION PHACO AND INTRAOCULAR LENS PLACEMENT (IOC) (Right) - [place]  01:10 AP% 19.5 CDE 13.70 fluid pack lot # [id number]  Electronically signed: [place] 10/08/2016 7:50 AM

## 2016-10-08 NOTE — Anesthesia Post-op Follow-up Note (Signed)
Anesthesia QCDR form completed.        

## 2016-10-08 NOTE — H&P (Signed)
All labs reviewed. Abnormal studies sent to patients PCP when indicated.  Previous H&P reviewed, patient examined, there are NO CHANGES.  Terri Collins LOUIS9/11/20187:08 AM

## 2016-10-08 NOTE — Anesthesia Preprocedure Evaluation (Signed)
Anesthesia Evaluation  Patient identified by MRN, date of birth, ID band Patient awake    Reviewed: Allergy & Precautions, NPO status , Patient's Chart, lab work & pertinent test results  History of Anesthesia Complications Negative for: history of anesthetic complications  Airway Mallampati: II  TM Distance: >3 FB Neck ROM: Full    Dental no notable dental hx.    Pulmonary neg pulmonary ROS, neg sleep apnea, neg COPD,    breath sounds clear to auscultation- rhonchi (-) wheezing      Cardiovascular hypertension, Pt. on medications (-) CAD, (-) Past MI and (-) Cardiac Stents  Rhythm:Regular Rate:Normal - Systolic murmurs and - Diastolic murmurs    Neuro/Psych PSYCHIATRIC DISORDERS Depression negative neurological ROS     GI/Hepatic negative GI ROS, Neg liver ROS,   Endo/Other  diabetes, Oral Hypoglycemic Agents  Renal/GU negative Renal ROS     Musculoskeletal  (+) Arthritis ,   Abdominal (+) - obese,   Peds  Hematology  (+) anemia ,   Anesthesia Other Findings Past Medical History: No date: Anemia No date: Arthritis 1999: Cancer ([place])     Comment:  right breast No date: Depression No date: Diabetes mellitus without complication (HCC) No date: Hyperlipidemia No date: Hypertension No date: Osteoporosis No date: Vitamin D deficiency   Reproductive/Obstetrics                             Anesthesia Physical  Anesthesia Plan  ASA: II  Anesthesia Plan: MAC   Post-op Pain Management:    Induction: Intravenous  PONV Risk Score and Plan: 2 and Midazolam  Airway Management Planned: Natural Airway  Additional Equipment:   Intra-op Plan:   Post-operative Plan:   Informed Consent: I have reviewed the patients History and Physical, chart, labs and discussed the procedure including the risks, benefits and alternatives for the proposed anesthesia with the patient or authorized  representative who has indicated his/her understanding and acceptance.     Plan Discussed with: CRNA and Anesthesiologist  Anesthesia Plan Comments:         Anesthesia Quick Evaluation

## 2016-10-09 ENCOUNTER — Encounter: Payer: Self-pay | Admitting: Ophthalmology

## 2016-12-16 ENCOUNTER — Other Ambulatory Visit: Payer: Self-pay | Admitting: Family Medicine

## 2016-12-16 DIAGNOSIS — R7989 Other specified abnormal findings of blood chemistry: Secondary | ICD-10-CM

## 2016-12-16 DIAGNOSIS — R945 Abnormal results of liver function studies: Principal | ICD-10-CM

## 2016-12-23 ENCOUNTER — Ambulatory Visit
Admission: RE | Admit: 2016-12-23 | Discharge: 2016-12-23 | Disposition: A | Discharge status: Home / Self Care | Payer: Medicare PPO | Source: Ambulatory Visit | Attending: Family Medicine | Admitting: Family Medicine

## 2016-12-23 DIAGNOSIS — K824 Cholesterolosis of gallbladder: Secondary | ICD-10-CM | POA: Diagnosis not present

## 2016-12-23 DIAGNOSIS — R945 Abnormal results of liver function studies: Secondary | ICD-10-CM | POA: Diagnosis present

## 2016-12-23 DIAGNOSIS — R7989 Other specified abnormal findings of blood chemistry: Secondary | ICD-10-CM

## 2016-12-26 ENCOUNTER — Other Ambulatory Visit: Payer: Self-pay | Admitting: Family Medicine

## 2016-12-26 DIAGNOSIS — R945 Abnormal results of liver function studies: Secondary | ICD-10-CM

## 2017-01-01 ENCOUNTER — Inpatient Hospital Stay: Payer: Medicare PPO

## 2017-01-01 ENCOUNTER — Other Ambulatory Visit: Payer: Self-pay

## 2017-01-01 ENCOUNTER — Inpatient Hospital Stay: Payer: Medicare PPO | Attending: Hematology and Oncology | Admitting: Hematology and Oncology

## 2017-01-01 ENCOUNTER — Other Ambulatory Visit: Payer: Self-pay | Admitting: Hematology and Oncology

## 2017-01-01 DIAGNOSIS — E119 Type 2 diabetes mellitus without complications: Secondary | ICD-10-CM | POA: Diagnosis not present

## 2017-01-01 DIAGNOSIS — D696 Thrombocytopenia, unspecified: Secondary | ICD-10-CM | POA: Diagnosis present

## 2017-01-01 DIAGNOSIS — E559 Vitamin D deficiency, unspecified: Secondary | ICD-10-CM | POA: Insufficient documentation

## 2017-01-01 DIAGNOSIS — D649 Anemia, unspecified: Secondary | ICD-10-CM

## 2017-01-01 DIAGNOSIS — Z9011 Acquired absence of right breast and nipple: Secondary | ICD-10-CM | POA: Diagnosis not present

## 2017-01-01 DIAGNOSIS — K766 Portal hypertension: Secondary | ICD-10-CM | POA: Insufficient documentation

## 2017-01-01 DIAGNOSIS — F329 Major depressive disorder, single episode, unspecified: Secondary | ICD-10-CM | POA: Diagnosis not present

## 2017-01-01 DIAGNOSIS — I10 Essential (primary) hypertension: Secondary | ICD-10-CM | POA: Insufficient documentation

## 2017-01-01 DIAGNOSIS — Z7982 Long term (current) use of aspirin: Secondary | ICD-10-CM | POA: Diagnosis not present

## 2017-01-01 DIAGNOSIS — Z79899 Other long term (current) drug therapy: Secondary | ICD-10-CM | POA: Insufficient documentation

## 2017-01-01 DIAGNOSIS — K7460 Unspecified cirrhosis of liver: Secondary | ICD-10-CM | POA: Diagnosis not present

## 2017-01-01 DIAGNOSIS — R17 Unspecified jaundice: Secondary | ICD-10-CM

## 2017-01-01 DIAGNOSIS — Z9223 Personal history of estrogen therapy: Secondary | ICD-10-CM | POA: Diagnosis not present

## 2017-01-01 DIAGNOSIS — E785 Hyperlipidemia, unspecified: Secondary | ICD-10-CM | POA: Diagnosis not present

## 2017-01-01 DIAGNOSIS — Z853 Personal history of malignant neoplasm of breast: Secondary | ICD-10-CM | POA: Insufficient documentation

## 2017-01-01 DIAGNOSIS — Z170 Estrogen receptor positive status [ER+]: Secondary | ICD-10-CM | POA: Diagnosis not present

## 2017-01-01 DIAGNOSIS — Z7984 Long term (current) use of oral hypoglycemic drugs: Secondary | ICD-10-CM | POA: Insufficient documentation

## 2017-01-01 DIAGNOSIS — K824 Cholesterolosis of gallbladder: Secondary | ICD-10-CM | POA: Insufficient documentation

## 2017-01-01 LAB — CBC WITH DIFFERENTIAL/PLATELET
Basophils Absolute: 0.1 10*3/uL (ref 0–0.1)
Basophils Relative: 1 %
Eosinophils Absolute: 0.4 10*3/uL (ref 0–0.7)
Eosinophils Relative: 7 %
HCT: 34 % — ABNORMAL LOW (ref 35.0–47.0)
Hemoglobin: 11.6 g/dL — ABNORMAL LOW (ref 12.0–16.0)
Lymphocytes Relative: 18 %
Lymphs Abs: 0.9 10*3/uL — ABNORMAL LOW (ref 1.0–3.6)
MCH: 33.2 pg (ref 26.0–34.0)
MCHC: 34.2 g/dL (ref 32.0–36.0)
MCV: 97.1 fL (ref 80.0–100.0)
Monocytes Absolute: 0.5 10*3/uL (ref 0.2–0.9)
Monocytes Relative: 10 %
Neutro Abs: 3.3 10*3/uL (ref 1.4–6.5)
Neutrophils Relative %: 64 %
Platelets: 91 10*3/uL — ABNORMAL LOW (ref 150–440)
RBC: 3.5 MIL/uL — ABNORMAL LOW (ref 3.80–5.20)
RDW: 14.2 % (ref 11.5–14.5)
WBC: 5.2 10*3/uL (ref 3.6–11.0)

## 2017-01-01 LAB — PROTIME-INR
INR: 1.16
Prothrombin Time: 14.7 seconds (ref 11.4–15.2)

## 2017-01-01 LAB — FOLATE: Folate: 20.3 ng/mL (ref >5.9)

## 2017-01-01 LAB — APTT: aPTT: 39 seconds — ABNORMAL HIGH (ref 24–36)

## 2017-01-01 LAB — RETICULOCYTES
RBC.: 3.53 MIL/uL — ABNORMAL LOW (ref 3.80–5.20)
Retic Count, Absolute: 49.4 10*3/uL (ref 19.0–183.0)
Retic Ct Pct: 1.4 % (ref 0.4–3.1)

## 2017-01-01 LAB — TSH: TSH: 0.166 u[IU]/mL — ABNORMAL LOW (ref 0.350–4.500)

## 2017-01-01 LAB — DAT, POLYSPECIFIC AHG (ARMC ONLY): Polyspecific AHG test: NEGATIVE

## 2017-01-01 NOTE — Progress Notes (Signed)
[facility] day:  01/01/2017  Chief Complaint: Terri Collins is a 71 y.o. female with abnormal platelets who is referred in consultation by Dr. Gayland Curry for assessment and management.  HPI:  The patient has had chronic mild thrombocytopenia dating back to at least 11/15/2011.  Platelet count has ranged between 76,000 - 122,000 without trend.  CBC on 12/05/2016 revealed a hematocrit of 32.7, hemoglobin 11.2, MCV 97, platelets 76,000, and WBC 4,800.  CBC on 12/16/2016 revealed  a hematocrit of 33.5, hemoglobin 11.4, MCV 96, platelets 96,000, and WBC 4,800.  Differential was unremarkable.  Abdominal ultrasound on 12/02/2011 revealed a spleen upper limits of normal (13.73 cm).  She has had a mildly elevated bilirubin since 03/28/2015.  Labs on 12/06/2016 revealed a bilirubin of 2.1, albumen 3.8, AST 66, ALT 41, and alkaline phosphatase of 82.  Direct bilirubin was 0.6.  Iron saturation was 19% with a TIBC of 411.  The following studies were negative: hepatitis A IgM, hepatitis B core IgM antibody, hepatitis B surface antigen, hepatitis C antibody, copper level, and anti-mitochondrial antibody screen.  Abdominal ultrasound on 12/23/2016 revealed a heterogeneous echotexture with lobulated contour of the liver suggestive of cirrhosis. Liver lesions could not be excluded. Evaluation limited by degree of liver heterogeneity. There was gallbladder wall thickening and free fluid adjacent to the gallbladder which may be related to cirrhosis rather primary gallbladder abnormality.  There was a 3 mm gallbladder polyp.  She is scheduled for an MRI of the liver on 01/02/2017.  She denies any change in medications except her cholesterol medication.  She denies any herbal products.  She denies any bruising or bleeding.  The patient has a history of stage II right breast cancer in 1999 (no records available).  She underwent surgery alone followed by 5 years of  tamoxifen.  Her sister had breast cancer in her early 45s.   Past Medical History:  Diagnosis Date  . Anemia   . Arthritis   . Cancer [facility]) 1999   right breast  . Depression   . Diabetes mellitus without complication ([place])   . Hyperlipidemia   . Hypertension   . Osteoporosis   . Vertigo   . Vitamin D deficiency     Past Surgical History:  Procedure Laterality Date  . BREAST BIOPSY Left 2006   benign. stereo no clip  . BREAST SURGERY    . CATARACT EXTRACTION W/PHACO Left 08/20/2016   Procedure: CATARACT EXTRACTION PHACO AND INTRAOCULAR LENS PLACEMENT (IOC);  Surgeon: Birder Robson, MD;  Location: [facility] ORS;  Service: Ophthalmology;  Laterality: Left;  [place] 01:11.3 AP% 20.4 CDE 14.58 FLUID PACK LOT # [id number]  . CATARACT EXTRACTION W/PHACO Right 10/08/2016   Procedure: CATARACT EXTRACTION PHACO AND INTRAOCULAR LENS PLACEMENT (IOC);  Surgeon: Birder Robson, MD;  Location: [facility] ORS;  Service: Ophthalmology;  Laterality: Right;  [place]  01:10 AP% 19.5 CDE 13.70 fluid pack lot # [id number]  . COLONOSCOPY    . COLONOSCOPY WITH PROPOFOL N/A 08/05/2014   Procedure: COLONOSCOPY WITH PROPOFOL;  Surgeon: Manya Silvas, MD;  Location: [facility] ENDOSCOPY;  Service: Endoscopy;  Laterality: N/A;  . ESOPHAGOGASTRODUODENOSCOPY    . MASTECTOMY Right 1999    Family History  Problem Relation Age of Onset  . Breast cancer Sister   . Breast cancer Maternal Aunt     Social History:  reports that  has never smoked. She does not have any smokeless tobacco history on file. She reports that  she does not drink alcohol. Her drug history is not on file.   She raised tobacco, but never smoked.  She drinks sangria on special occasions.  She worked at [employer] for 20 years then [employer].  Her husband died 3 years ago.  She has 2 daughters.  She lives in [place].  The patient is alone today.  Allergies: No Known Allergies  Current Medications: Current Outpatient Medications  Medication Sig Dispense  Refill  . aspirin EC 81 MG tablet Take 81 mg by mouth daily.    Marland Kitchen atorvastatin (LIPITOR) 20 MG tablet Take 20 mg by mouth daily.    . Cholecalciferol (VITAMIN D3) 2000 UNITS capsule Take 2,000 Units by mouth daily.    . enalapril (VASOTEC) 10 MG tablet Take 20 mg by mouth daily.     Marland Kitchen glipiZIDE (GLUCOTROL) 5 MG tablet Take 5 mg by mouth daily before breakfast.    . glucose blood test strip 1 each by Other route as needed for other. Use as instructed    . hydrochlorothiazide (HYDRODIURIL) 25 MG tablet Take 25 mg by mouth daily.    . metFORMIN (GLUCOPHAGE) 500 MG tablet Take 1,000 mg by mouth 2 (two) times daily with a meal.     No current facility-administered medications for this visit.     Review of Systems:  GENERAL:  Feels "like I have been feeling".  No complaint.  No fevers, sweats or weight loss. PERFORMANCE STATUS (ECOG):  1 HEENT:  No visual changes, runny nose, sore throat, mouth sores or tenderness. Lungs: No shortness of breath or cough.  No hemoptysis. Cardiac:  No chest pain, palpitations, orthopnea, or PND. GI:  h/o fatty liver.  Rare emesis.  No reflux, nausea, diarrhea, constipation, melena or hematochezia. GU:  No urgency, frequency, dysuria, or hematuria. Musculoskeletal:  No back pain.  Left shoulder pain.  No muscle tenderness. Extremities:  No pain or swelling. Skin:  No rashes or skin changes. Neuro:  No headache, numbness or weakness, balance or coordination issues. Endocrine:  Diabetes.  Occasional sweats with decreased blood sugar.  No thyroid issues, hot flashes or night sweats. Psych:  No mood changes, depression or anxiety. Pain:  No focal pain. Review of systems:  All other systems reviewed and found to be negative.  Physical Exam: Blood pressure 134/79, pulse 89, temperature 98.4 F (36.9 C), temperature source Tympanic, height 5\' 3"  (1.6 m), weight 118 lb 2.7 oz (53.6 kg). GENERAL:  Well developed, well nourished, woman sitting comfortably in the exam  room in no acute distress. MENTAL STATUS:  Alert and oriented to person, place and time. HEAD:  Blonde styled hair.  Normocephalic, atraumatic, face symmetric, no Cushingoid features. EYES:  Blue eyes.  Right eye twitch.  Pupils equal round and reactive to light and accomodation.  No conjunctivitis or scleral icterus. ENT:  Oropharynx clear without lesion.  Tongue normal. Mucous membranes moist.  RESPIRATORY:  Clear to auscultation without rales, wheezes or rhonchi. CARDIOVASCULAR:  Regular rate and rhythm without murmur, rub or gallop. ABDOMEN:  Soft, non-tender, with active bowel sounds, and no hepatomegaly.  Spleen tip is palpable.  No masses. SKIN:  No rashes, ulcers or lesions. EXTREMITIES: No edema, no skin discoloration or tenderness.  No palpable cords. LYMPH NODES: No palpable cervical, supraclavicular, axillary or inguinal adenopathy  NEUROLOGICAL: Unremarkable. PSYCH:  Appropriate.   Appointment on 01/01/2017  Component Date Value Ref Range Status  . aPTT 01/01/2017 39* 24 - 36 seconds Final  Comment:        IF BASELINE aPTT IS ELEVATED, SUGGEST PATIENT RISK ASSESSMENT BE USED TO DETERMINE APPROPRIATE ANTICOAGULANT THERAPY.   . Prothrombin Time 01/01/2017 14.7  11.4 - 15.2 seconds Final  . INR 01/01/2017 1.16   Final  . TSH 01/01/2017 0.166* 0.350 - 4.500 uIU/mL Final   Performed by a 3rd Generation assay with a functional sensitivity of <=0.01 uIU/mL.  Marland Kitchen Anit Nuclear Antibody(ANA) 01/01/2017 Negative  Negative Final   Comment: (NOTE) Performed At: [facility] [place], [place] [id number] Rush Farmer MD EG:[id number]   . Folate 01/01/2017 20.3  >5.9 ng/mL Final  . Vitamin B-12 01/01/2017 507  180 - 914 pg/mL Final   Comment: (NOTE) This assay is not validated for testing neonatal or myeloproliferative syndrome specimens for Vitamin B12 levels. Performed at [facility], [place] [address] [place], [place] [address]   . Kappa free  light chain 01/01/2017 50.0* 3.3 - 19.4 mg/L Final  . Lamda free light chains 01/01/2017 40.8* 5.7 - 26.3 mg/L Final  . Kappa, lamda light chain ratio 01/01/2017 1.23  0.26 - 1.65 Final   Comment: (NOTE) Performed At: [facility] [place], [place] [id number] Rush Farmer MD VO:[id number]   . IgG (Immunoglobin G), Serum 01/01/2017 1,097  700 - 1,600 mg/dL Final  . IgA 01/01/2017 420  64 - 422 mg/dL Final  . IgM (Immunoglobulin M), Srm 01/01/2017 141  26 - 217 mg/dL Final  . Total Protein ELP 01/01/2017 6.7  6.0 - 8.5 g/dL Corrected  . Albumin SerPl Elph-Mcnc 01/01/2017 3.6  2.9 - 4.4 g/dL Corrected  . Alpha 1 01/01/2017 0.3  0.0 - 0.4 g/dL Corrected  . Alpha2 Glob SerPl Elph-Mcnc 01/01/2017 0.5  0.4 - 1.0 g/dL Corrected  . B-Globulin SerPl Elph-Mcnc 01/01/2017 1.0  0.7 - 1.3 g/dL Corrected  . Gamma Glob SerPl Elph-Mcnc 01/01/2017 1.2  0.4 - 1.8 g/dL Corrected  . M Protein SerPl Elph-Mcnc 01/01/2017 Not Observed  Not Observed g/dL Corrected  . Globulin, Total 01/01/2017 3.1  2.2 - 3.9 g/dL Corrected  . Albumin/Glob SerPl 01/01/2017 1.2  0.7 - 1.7 Corrected  . IFE 1 01/01/2017 Comment   Corrected   Comment: (NOTE) An apparent polyclonal gammopathy: IgA. Kappa and lambda typing appear increased.   . Please Note 01/01/2017 Comment   Corrected   Comment: (NOTE) Protein electrophoresis scan will follow via computer, mail, or courier delivery. Performed At: [facility] [place], [place] [id number] Rush Farmer MD [id number]   . HIV Screen 4th Generation wRfx 01/01/2017 Non Reactive  Non Reactive Final   Comment: (NOTE) Performed At: [facility] [address] [place], [place] [id number] Rush Farmer MD [id number]   . Hep B Core Total Ab 01/01/2017 Negative  Negative Final   Comment: (NOTE) Performed At: [facility] [place], [place] [id number] Rush Farmer MD [id number]   . Polyspecific  AHG test 01/01/2017 NEG   Final  . Retic Ct Pct 01/01/2017 1.4  0.4 - 3.1 % Final  . RBC. 01/01/2017 3.53* 3.80 - 5.20 MIL/uL Final  . Retic Count, Absolute 01/01/2017 49.4  19.0 - 183.0 K/uL Final  . WBC 01/01/2017 5.2  3.6 - 11.0 K/uL Final  . RBC 01/01/2017 3.50* 3.80 - 5.20 MIL/uL Final  . Hemoglobin 01/01/2017 11.6* 12.0 - 16.0 g/dL Final  . HCT 01/01/2017 34.0* 35.0 - 47.0 % Final  . MCV 01/01/2017 97.1  80.0 - 100.0 fL  Final  . MCH 01/01/2017 33.2  26.0 - 34.0 pg Final  . MCHC 01/01/2017 34.2  32.0 - 36.0 g/dL Final  . RDW 01/01/2017 14.2  11.5 - 14.5 % Final  . Platelets 01/01/2017 91* 150 - 440 K/uL Final   PLATELET FROM CITRATE TUBE  83  . Neutrophils Relative % 01/01/2017 64  % Final  . Neutro Abs 01/01/2017 3.3  1.4 - 6.5 K/uL Final  . Lymphocytes Relative 01/01/2017 18  % Final  . Lymphs Abs 01/01/2017 0.9* 1.0 - 3.6 K/uL Final  . Monocytes Relative 01/01/2017 10  % Final  . Monocytes Absolute 01/01/2017 0.5  0.2 - 0.9 K/uL Final  . Eosinophils Relative 01/01/2017 7  % Final  . Eosinophils Absolute 01/01/2017 0.4  0 - 0.7 K/uL Final  . Basophils Relative 01/01/2017 1  % Final  . Basophils Absolute 01/01/2017 0.1  0 - 0.1 K/uL Final    Assessment:  Terri Collins is a 71 y.o. 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  Platelet count has ranged between 76,000 - 122,000 without trend.  Platelets count was 76,000 on 12/05/2016 and 96,000 on 12/16/2016.  WBC and differential are unremarkable.  She denies any new medications or herbal products.  She has a mild normocytic anemia.  Hematocrit was 33.5, hemoglobin 11.4, and MCV 96 on 12/16/2016.  Iron saturation was 19% with a TIBC of 411 on 12/16/2016.  She has had a mildly elevated bilirubin since 03/28/2015.  Bilirubin was 2.1 (direct 0.6) on 12/06/2016.  Normal studies inlcuded: hepatitis A IgM, hepatitis B core IgM antibody, hepatitis B surface antigen, hepatitis C antibody, copper level,  and anti-mitochondrial antibody screen.  Abdominal ultrasound on 12/02/2011 revealed a spleen upper limits of normal (13.73 cm).  Abdominal ultrasound on 12/23/2016 revealed a heterogeneous echotexture with lobulated contour of the liver suggestive of cirrhosis.  There was gallbladder wall thickening and free fluid adjacent to the gallbladder which may be related to cirrhosis.  There was a 3 mm gallbladder polyp.  She is scheduled for an MRI of the liver on 01/02/2017.  Symptomatically, she denies any complaint.  She denies any bruising or bleeding.  Spleen tip is palpable.  Plan: 1.  Discuss mild fluctuating thrombocytopenia x at least 5 years.  Discuss possible ITP or thrombocytopenia secondary to underlying liver disease (cirrhosis).  Discuss laboratory work-up.  Patient consents to hepatitis and HIV testing. 2.  Discuss mild anemia and mildly elevated bilirubin (indirect). Possible Gilbert's disease or underlying hemolysis. 3.  Discuss imaging suggesting cirrhosis.  Assess spleen size.  Contact radiology regarding upcoming liver/abdominal MRI. 4.  Labs today:  CBC with diff, platelet count in a blue top tube, retic, DAT, PT, PTT, hepatitis B core antibody total, HIV antibody, AFP, ANA with reflex, ferritin, B12, folate, TSH, myeloma panel, free light chains. 5.  RTC in 1 week for MD assessment, review of work-up and discussion regarding direction of therapy.   Lequita Asal, MD  01/01/2017, 4:35 PM

## 2017-01-01 NOTE — Progress Notes (Signed)
Patient here today as new evaluation regarding polycythemia.  Referred by Dr. Astrid Divine. Patient states she had an [place] recently for her gall bladder.  She has an MRI tomorrow to look at her gall bladder and liver.  Also has a GI consult next Tuesday.

## 2017-01-02 ENCOUNTER — Ambulatory Visit
Admission: RE | Admit: 2017-01-02 | Discharge: 2017-01-02 | Disposition: A | Discharge status: Home / Self Care | Payer: Medicare PPO | Source: Ambulatory Visit | Attending: Family Medicine | Admitting: Family Medicine

## 2017-01-02 ENCOUNTER — Other Ambulatory Visit: Payer: Self-pay | Admitting: *Deleted

## 2017-01-02 DIAGNOSIS — R945 Abnormal results of liver function studies: Secondary | ICD-10-CM

## 2017-01-02 DIAGNOSIS — R932 Abnormal findings on diagnostic imaging of liver and biliary tract: Secondary | ICD-10-CM | POA: Diagnosis present

## 2017-01-02 DIAGNOSIS — Q441 Other congenital malformations of gallbladder: Secondary | ICD-10-CM | POA: Insufficient documentation

## 2017-01-02 DIAGNOSIS — K769 Liver disease, unspecified: Secondary | ICD-10-CM | POA: Diagnosis not present

## 2017-01-02 DIAGNOSIS — D696 Thrombocytopenia, unspecified: Secondary | ICD-10-CM

## 2017-01-02 DIAGNOSIS — Z853 Personal history of malignant neoplasm of breast: Secondary | ICD-10-CM | POA: Diagnosis present

## 2017-01-02 LAB — HIV ANTIBODY (ROUTINE TESTING W REFLEX): HIV Screen 4th Generation wRfx: NONREACTIVE

## 2017-01-02 LAB — VITAMIN B12: Vitamin B-12: 507 pg/mL (ref 180–914)

## 2017-01-02 LAB — KAPPA/LAMBDA LIGHT CHAINS
Kappa free light chain: 50 mg/L — ABNORMAL HIGH (ref 3.3–19.4)
Kappa, lambda light chain ratio: 1.23 (ref 0.26–1.65)
Lambda free light chains: 40.8 mg/L — ABNORMAL HIGH (ref 5.7–26.3)

## 2017-01-02 LAB — HEPATITIS B CORE ANTIBODY, TOTAL: Hep B Core Total Ab: NEGATIVE

## 2017-01-02 LAB — ANA W/REFLEX: Anti Nuclear Antibody(ANA): NEGATIVE

## 2017-01-02 IMAGING — MR MR ABDOMEN WO/W CM
16 of 17 series · 44 of 48 positions shown · IV contrast (10 ML MULTIHANCE)
Comparison: 12/23/2016

CLINICAL DATA: Followup ultrasound

EXAM:
MRI ABDOMEN WITHOUT AND WITH CONTRAST
TECHNIQUE: Multiplanar multisequence MR imaging of the abdomen was performed
both before and after the administration of intravenous contrast.
CONTRAST:  10mL MULTIHANCE GADOBENATE DIMEGLUMINE 529 MG/ML IV SOLN

[Series 2: cor ssfse / · coronal · 7.0mm · 1.48mm/px · 2 of 28 slices shown]
[im 1/28]
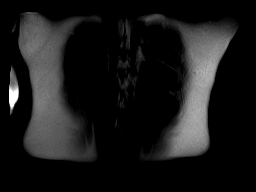
[im 28/28]
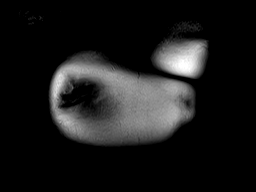

[Series 3: T2 fat-sat · axial · 6.0mm · 1.48mm/px · z∈[-30,+186]mm · 2 of 31 slices shown]
[im 1/31]
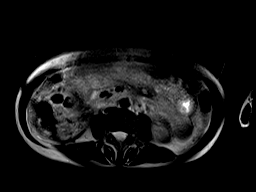
[im 31/31]
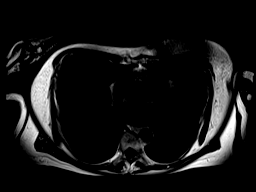

[Series 4: bSSFP · axial · 6.0mm · 0.74mm/px · z∈[-30,+186]mm · 2 of 31 slices shown]
[im 1/31]
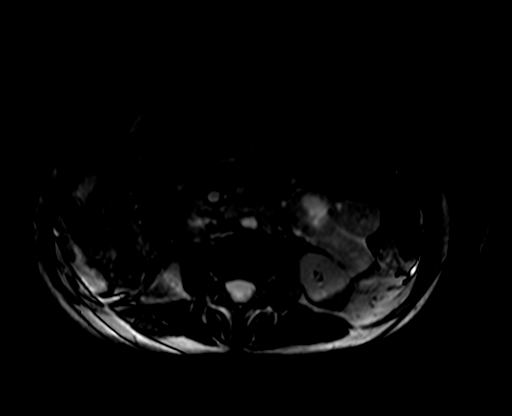
[im 31/31]
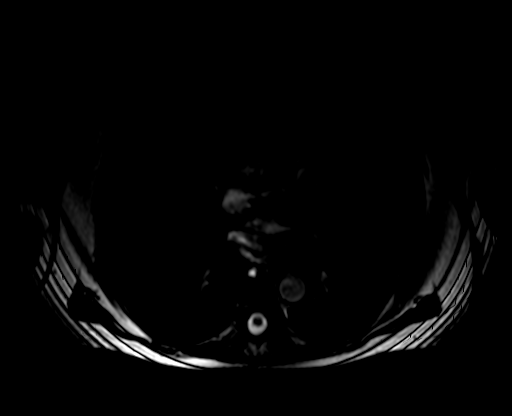

[Series 5: T1 · axial · 6.0mm · 0.74mm/px · z∈[-30,+186]mm · 4 of 62 slices shown]
[im 1/62]
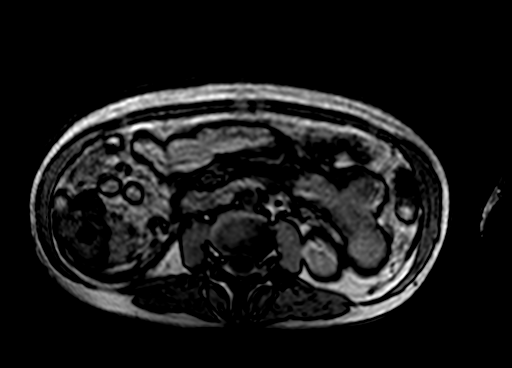
[im 21/62]
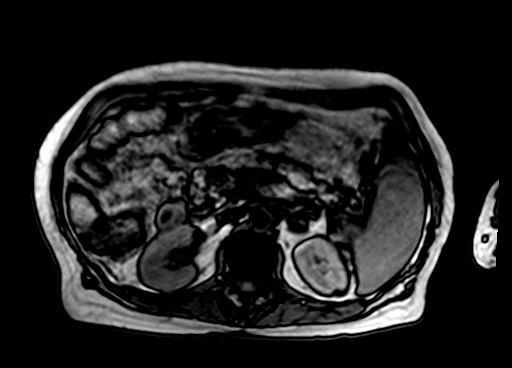
[im 41/62]
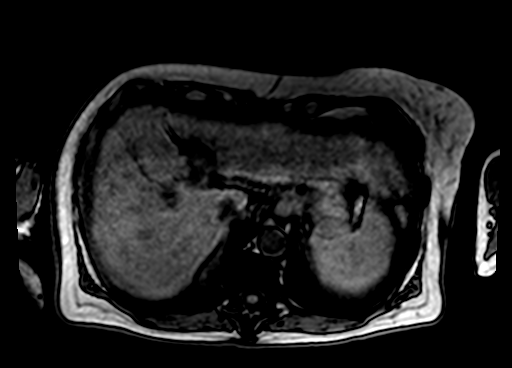
[im 62/62]
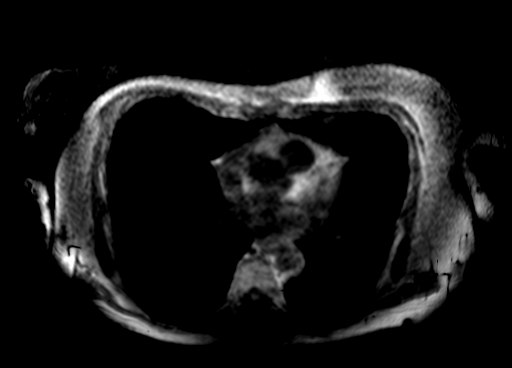

[Series 6: axial dynamic pre · axial · non-contrast · 3.0mm · 1.19mm/px · z∈[-28,+185]mm · 4 of 72 slices shown]
[im 1/72]
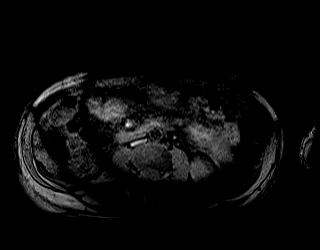
[im 24/72]
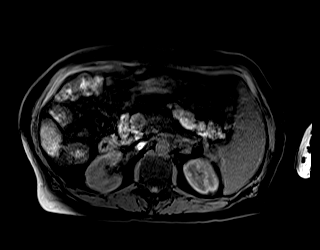
[im 48/72]
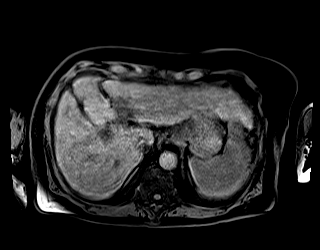
[im 72/72]
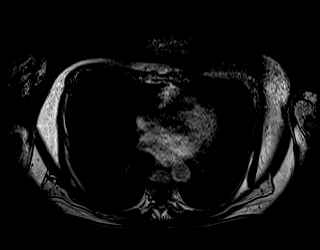

[Series 7: DWI · axial · 6.0mm · 2.00mm/px · z∈[-30,+186]mm · 4 of 93 slices shown (1 of 2)]
[im 1/93]
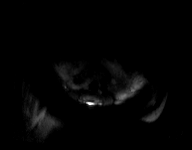
[im 31/93]
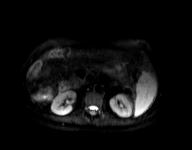
[im 62/93]
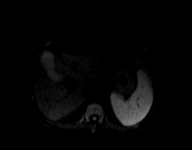
[im 93/93]
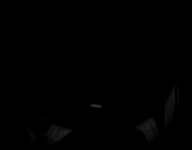

[Series 8: DWI · axial · 6.0mm · 2.00mm/px · 1 of 31 slices shown (2 of 2)]
[im 1/31]
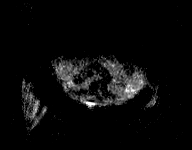

[Series 9: axial dynamic post · axial · 3.0mm · 1.19mm/px · z∈[-28,+185]mm · 3 of 72 slices shown (1 of 6)]
[im 1/72]
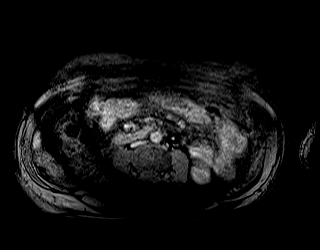
[im 36/72]
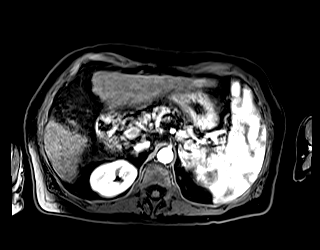
[im 72/72]
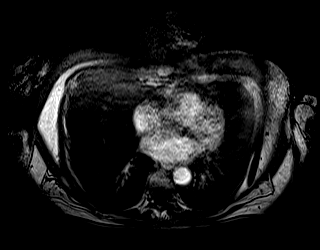

[Series 10: axial dynamic post · axial · 3.0mm · 1.19mm/px · z∈[-28,+185]mm · 3 of 72 slices shown (2 of 6)]
[im 1/72]
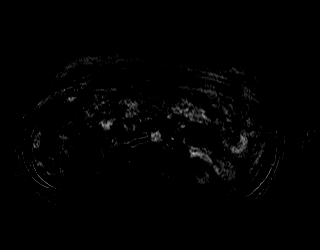
[im 36/72]
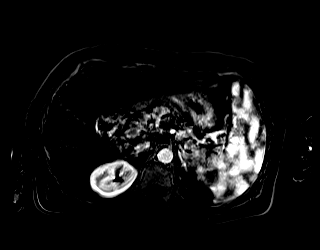
[im 72/72]
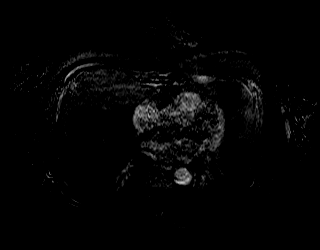

[Series 11: axial dynamic post · axial · 3.0mm · 1.19mm/px · z∈[-28,+185]mm · 3 of 72 slices shown (3 of 6)]
[im 1/72]
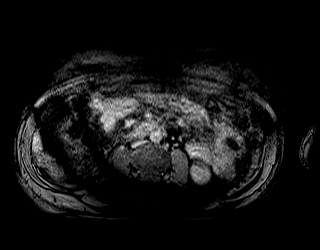
[im 36/72]
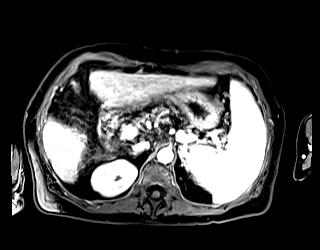
[im 72/72]
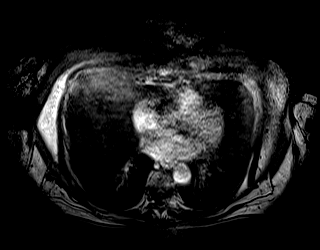

[Series 12: axial dynamic post · axial · 3.0mm · 1.19mm/px · z∈[-28,+185]mm · 3 of 72 slices shown (4 of 6)]
[im 1/72]
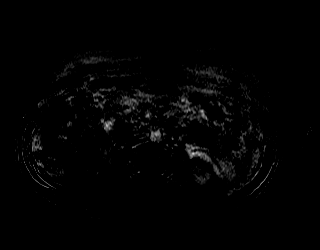
[im 36/72]
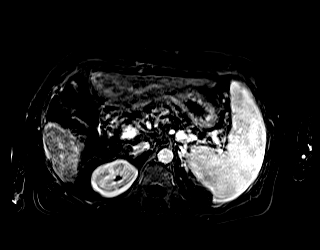
[im 72/72]
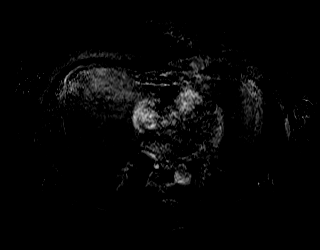

[Series 13: axial dynamic post · axial · 3.0mm · 1.19mm/px · z∈[-28,+185]mm · 3 of 72 slices shown (5 of 6)]
[im 1/72]
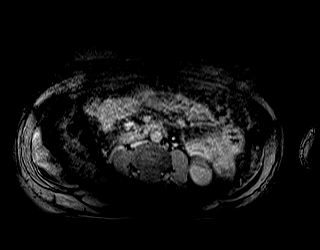
[im 36/72]
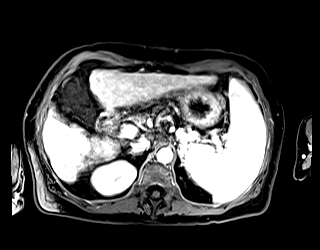
[im 72/72]
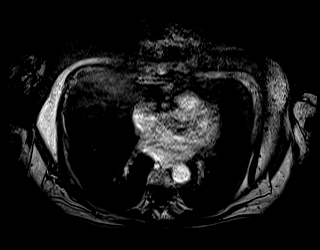

[Series 14: axial dynamic post · axial · 3.0mm · 1.19mm/px · z∈[-28,+185]mm · 3 of 72 slices shown (6 of 6)]
[im 1/72]
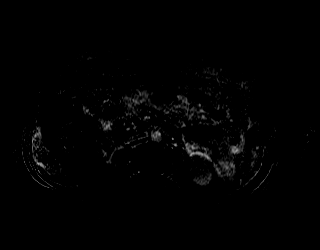
[im 36/72]
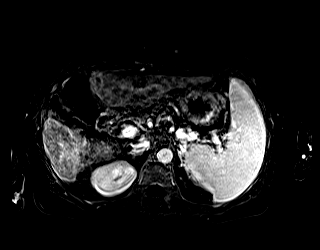
[im 72/72]
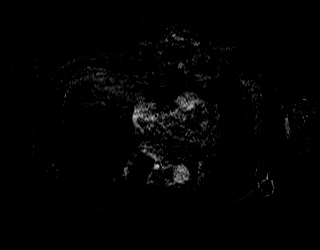

[Series 16: axial dynamic delayed · axial · 3.0mm · 1.19mm/px · z∈[-28,+185]mm · 3 of 72 slices shown]
[im 1/72]
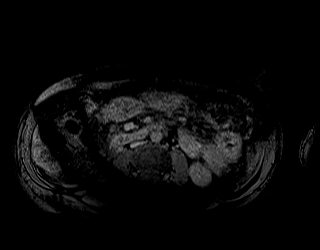
[im 36/72]
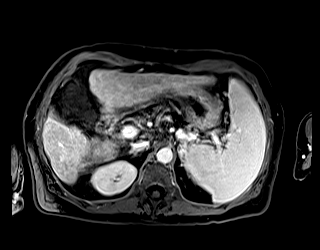
[im 72/72]
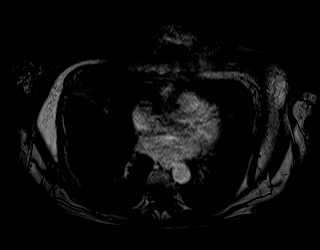

[Series 17: axial dynamic delayed_sub · axial · 3.0mm · 1.19mm/px · z∈[-28,+185]mm · 3 of 72 slices shown]
[im 1/72]
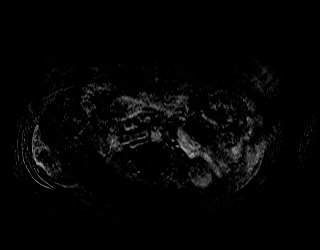
[im 36/72]
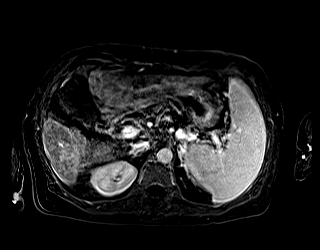
[im 72/72]
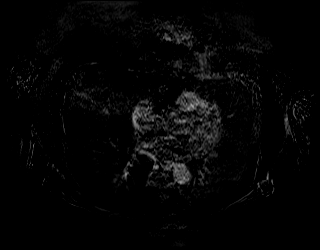

[Series 18: axial ssfse / · axial · 6.0mm · 1.19mm/px · 1 of 31 slices shown]
[im 1/31]
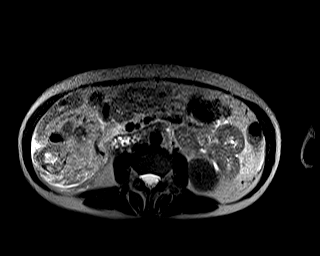

[44 of 48 positions shown; findings below may reference images not displayed]

FINDINGS: Lower chest: No acute abnormality.

Hepatobiliary: There is hypertrophy of both caudate lobe and lateral
segment of left lobe of liver. The contour the liver is diffusely
heterogeneous. Diffuse hepatic steatosis is identified. Posterior
right lobe of liver arterial phase enhancing structure has a
nonspecific appearance measuring 9 mm, image 30 of series 9. On the
portal venous and delayed phase images there is no pseudo capsule or
washout identified. This is compatible with HESS3: Intermediate
probability for HCC. The gallbladder appears within normal limits.
There is no biliary dilatation.

Pancreas: No mass, inflammatory changes, or other parenchymal
abnormality identified.

Spleen: The spleen is enlarged measuring 12.5 x 5.9 x 13.0 (volume =
500).

Adrenals/Urinary Tract: The adrenal glands are normal. Unremarkable
appearance of both kidneys.

Stomach/Bowel: Visualized portions within the abdomen are
unremarkable.

Vascular/Lymphatic: No abdominal aortic aneurysm. The portal vein
appears patent. Esophageal varices are identified.

Other:  Small volume of perihepatic ascites.

Musculoskeletal: No suspicious bone lesions identified.
IMPRESSION: 1. Morphologic features of the liver compatible with cirrhosis.
Stigmata of portal venous hypertension is noted including
splenomegaly, esophageal varices, and ascites
2. Arterial phase enhancing structure in the posterior right lobe of
liver is is considered HESS3 lesion, indeterminate probability for
HCC. Recommend followup imaging in 6 months to assess for any
threshold growth or change in imaging characteristics of this
structure.

## 2017-01-02 MED ORDER — GADOBENATE DIMEGLUMINE 529 MG/ML IV SOLN
10.0000 mL | Freq: Once | INTRAVENOUS | Status: AC | PRN
  Administered 2017-01-02: 10 mL via INTRAVENOUS

## 2017-01-03 LAB — MULTIPLE MYELOMA PANEL, SERUM
Albumin SerPl Elph-Mcnc: 3.6 g/dL (ref 2.9–4.4)
Albumin/Glob SerPl: 1.2 (ref 0.7–1.7)
Alpha 1: 0.3 g/dL (ref 0.0–0.4)
Alpha2 Glob SerPl Elph-Mcnc: 0.5 g/dL (ref 0.4–1.0)
B-Globulin SerPl Elph-Mcnc: 1 g/dL (ref 0.7–1.3)
Gamma Glob SerPl Elph-Mcnc: 1.2 g/dL (ref 0.4–1.8)
Globulin, Total: 3.1 g/dL (ref 2.2–3.9)
IgA: 420 mg/dL (ref 64–422)
IgG (Immunoglobin G), Serum: 1097 mg/dL (ref 700–1600)
IgM (Immunoglobulin M), Srm: 141 mg/dL (ref 26–217)
Total Protein ELP: 6.7 g/dL (ref 6.0–8.5)

## 2017-01-06 ENCOUNTER — Encounter: Payer: Self-pay | Admitting: Hematology and Oncology

## 2017-01-08 ENCOUNTER — Inpatient Hospital Stay: Payer: Medicare PPO

## 2017-01-08 ENCOUNTER — Inpatient Hospital Stay (HOSPITAL_BASED_OUTPATIENT_CLINIC_OR_DEPARTMENT_OTHER): Payer: Medicare PPO | Admitting: Hematology and Oncology

## 2017-01-08 VITALS — BP 127/72 | HR 91 | Temp 98.3°F | Resp 18 | Wt 118.2 lb

## 2017-01-08 DIAGNOSIS — Z170 Estrogen receptor positive status [ER+]: Secondary | ICD-10-CM | POA: Diagnosis not present

## 2017-01-08 DIAGNOSIS — R188 Other ascites: Secondary | ICD-10-CM

## 2017-01-08 DIAGNOSIS — K824 Cholesterolosis of gallbladder: Secondary | ICD-10-CM

## 2017-01-08 DIAGNOSIS — R17 Unspecified jaundice: Secondary | ICD-10-CM | POA: Diagnosis not present

## 2017-01-08 DIAGNOSIS — I8500 Esophageal varices without bleeding: Secondary | ICD-10-CM

## 2017-01-08 DIAGNOSIS — Z79899 Other long term (current) drug therapy: Secondary | ICD-10-CM

## 2017-01-08 DIAGNOSIS — Z9011 Acquired absence of right breast and nipple: Secondary | ICD-10-CM | POA: Diagnosis not present

## 2017-01-08 DIAGNOSIS — Z853 Personal history of malignant neoplasm of breast: Secondary | ICD-10-CM | POA: Diagnosis not present

## 2017-01-08 DIAGNOSIS — Z9223 Personal history of estrogen therapy: Secondary | ICD-10-CM | POA: Diagnosis not present

## 2017-01-08 DIAGNOSIS — D696 Thrombocytopenia, unspecified: Secondary | ICD-10-CM

## 2017-01-08 DIAGNOSIS — D649 Anemia, unspecified: Secondary | ICD-10-CM | POA: Diagnosis not present

## 2017-01-08 DIAGNOSIS — K7460 Unspecified cirrhosis of liver: Secondary | ICD-10-CM | POA: Insufficient documentation

## 2017-01-08 DIAGNOSIS — R161 Splenomegaly, not elsewhere classified: Secondary | ICD-10-CM

## 2017-01-08 LAB — FERRITIN: FERRITIN: 25 ng/mL (ref 11–307)

## 2017-01-08 LAB — T4: T4, Total: 7.7 ug/dL (ref 4.5–12.0)

## 2017-01-08 LAB — IRON AND TIBC
IRON: 62 ug/dL (ref 28–170)
SATURATION RATIOS: 16 % (ref 10.4–31.8)
TIBC: 398 ug/dL (ref 250–450)
UIBC: 336 ug/dL

## 2017-01-08 LAB — T4, FREE: FREE T4: 0.9 ng/dL (ref 0.61–1.12)

## 2017-01-08 NOTE — Progress Notes (Signed)
Patient offers no complaints today. 

## 2017-01-08 NOTE — Progress Notes (Signed)
[facility] day:  01/08/2017   Chief Complaint: Terri Collins is a 71 y.o. female with mild thrombocytopenia for review of work-up and discussion regarding direction of therapy.  HPI:  The patient was last seen in the hematology clinic on 01/01/2017 for initial consultation.   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  Platelet count had ranged between 76,000 - 122,000 without trend.  She was also noted to have mild anemia and mildly elevated bilirubin (indirect) suggesting possible Gilbert's disease or underlying hemolysis.  Prior imaging suggested cirrhosis.  She had previously been scheduled for a liver MRI.  Liver MRI on 01/02/2017 revealed morphologic features of the liver compatible with cirrhosis.  There was stigmata of portal venous hypertension including splenomegaly, esophageal varices, and ascites.  Spleen measured 12.5 x 5.9 x 13 cm (volume 500 cm3).  There was an arterial phase enhancing structure in the posterior right lobe of liver considered an LI-3 lesion, indeterminate probability for [facility]. Recommendation was for followup imaging in 6 months to assess for any threshold growth or change in imaging characteristics of this structure.  She underwent a work-up on 01/01/2017.  CBC revealed a hematocrit of 34, hemoglobin 11.6, MCV 97.1, platelets 91,000, white count of 5200 with an ANC of 3300. Differential included 64% segs, 18% lymphs, 10% monocytes and 7% eosinophils. Platelet count in a citrate tube was 83,000.  Retic was 1.4%.  PT was 14.7 (INR 1.16) and PTT 39 (24-36).  TSH was 0.166 (0/35-4.5).  B12 was 507 and folate 20.3.  ANA was negative.  SPEP was negative.  Kappa free light chains were 50, lambda free light chains 40.8 with a ratio of 1.23 (normal).  HIV testing was negative.  Hepatitis B core antibody total was negative.  Symptomatically, she feels better today than yesterday.  She worries about her  liver.  She saw Tammi Klippel, PA yesterday.  Plans are for upper endoscopy and liver imaging.   Past Medical History:  Diagnosis Date  . Anemia   . Arthritis   . Cancer [facility]) 1999   right breast  . Depression   . Diabetes mellitus without complication ([place])   . Hyperlipidemia   . Hypertension   . Osteoporosis   . Vertigo   . Vitamin D deficiency     Past Surgical History:  Procedure Laterality Date  . BREAST BIOPSY Left 2006   benign. stereo no clip  . BREAST SURGERY    . CATARACT EXTRACTION W/PHACO Left 08/20/2016   Procedure: CATARACT EXTRACTION PHACO AND INTRAOCULAR LENS PLACEMENT (IOC);  Surgeon: Birder Robson, MD;  Location: [facility] ORS;  Service: Ophthalmology;  Laterality: Left;  [place] 01:11.3 AP% 20.4 CDE 14.58 FLUID PACK LOT # [id number]  . CATARACT EXTRACTION W/PHACO Right 10/08/2016   Procedure: CATARACT EXTRACTION PHACO AND INTRAOCULAR LENS PLACEMENT (IOC);  Surgeon: Birder Robson, MD;  Location: [facility] ORS;  Service: Ophthalmology;  Laterality: Right;  [place]  01:10 AP% 19.5 CDE 13.70 fluid pack lot # [id number]  . COLONOSCOPY    . COLONOSCOPY WITH PROPOFOL N/A 08/05/2014   Procedure: COLONOSCOPY WITH PROPOFOL;  Surgeon: Manya Silvas, MD;  Location: [facility] ENDOSCOPY;  Service: Endoscopy;  Laterality: N/A;  . ESOPHAGOGASTRODUODENOSCOPY    . MASTECTOMY Right 1999    Family History  Problem Relation Age of Onset  . Breast cancer Sister   . Breast cancer Maternal Aunt     Social History:  reports that  has never  smoked. she has never used smokeless tobacco. She reports that she does not drink alcohol. Her drug history is not on file.   She raised tobacco, but never smoked.  She drinks sangria on special occasions.  She worked at [employer] for 20 years then [employer].  Her husband died 3 years ago.  She has 2 daughters.  She lives in [place].  The patient is alone today.  Allergies: No Known Allergies  Current Medications: Current Outpatient Medications   Medication Sig Dispense Refill  . aspirin EC 81 MG tablet Take 81 mg by mouth daily.    Marland Kitchen atorvastatin (LIPITOR) 20 MG tablet Take 20 mg by mouth daily.    . Cholecalciferol (VITAMIN D3) 2000 UNITS capsule Take 2,000 Units by mouth daily.    . enalapril (VASOTEC) 10 MG tablet Take 20 mg by mouth daily.     Marland Kitchen glipiZIDE (GLUCOTROL) 5 MG tablet Take 5 mg by mouth daily before breakfast.    . glucose blood test strip 1 each by Other route as needed for other. Use as instructed    . hydrochlorothiazide (HYDRODIURIL) 25 MG tablet Take 25 mg by mouth daily.    . metFORMIN (GLUCOPHAGE) 500 MG tablet Take 1,000 mg by mouth 2 (two) times daily with a meal.     No current facility-administered medications for this visit.     Review of Systems:  GENERAL:  Feels "ok".  No complaint.  No fevers, sweats or weight loss. PERFORMANCE STATUS (ECOG):  1 HEENT:  No visual changes, runny nose, sore throat, mouth sores or tenderness. Lungs: No shortness of breath or cough.  No hemoptysis. Cardiac:  No chest pain, palpitations, orthopnea, or PND. GI:  h/o fatty liver.  Rare emesis.  No reflux, nausea, diarrhea, constipation, melena or hematochezia. GU:  No urgency, frequency, dysuria, or hematuria. Musculoskeletal:  No back pain.  Left shoulder pain.  No muscle tenderness. Extremities:  No pain or swelling. Skin:  No rashes or skin changes. Neuro:  No headache, numbness or weakness, balance or coordination issues. Endocrine:  Diabetes.  Occasional sweats with decreased blood sugar.  No thyroid issues, hot flashes or night sweats. Psych:  Worried about liver.  No mood changes, depression or anxiety. Pain:  No focal pain. Review of systems:  All other systems reviewed and found to be negative.  Physical Exam: Blood pressure 127/72, pulse 91, temperature 98.3 F (36.8 C), temperature source Tympanic, resp. rate 18, weight 118 lb 2.7 oz (53.6 kg), SpO2 100 %. GENERAL:  Well developed, well nourished, woman  sitting comfortably in the exam room in no acute distress. MENTAL STATUS:  Alert and oriented to person, place and time. HEAD:  Blonde styled hair.  Normocephalic, atraumatic, face symmetric, no Cushingoid features. EYES:  Blue eyes.  Right eye twitch.  No conjunctivitis or scleral icterus. SKIN:  No rashes, ulcers or lesions. NEUROLOGICAL: Unremarkable. PSYCH:  Appropriate.   Appointment on 01/08/2017  Component Date Value Ref Range Status  . Free T4 01/08/2017 0.90  0.61 - 1.12 ng/dL Final   Comment: (NOTE) Biotin ingestion may interfere with free T4 tests. If the results are inconsistent with the TSH level, previous test results, or the clinical presentation, then consider biotin interference. If needed, order repeat testing after stopping biotin.   . Ferritin 01/08/2017 25  11 - 307 ng/mL Final  . Iron 01/08/2017 62  28 - 170 ug/dL Final  . TIBC 01/08/2017 398  250 - 450 ug/dL Final  .  Saturation Ratios 01/08/2017 16  10.4 - 31.8 % Final  . UIBC 01/08/2017 336  ug/dL Final    Assessment:  Mauriah Mcmillen Acord is a 71 y.o. 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  Platelet count has ranged between 76,000 - 122,000 without trend.  Platelets count was 76,000 on 12/05/2016 and 96,000 on 12/16/2016.  WBC and differential are unremarkable.  She denies any new medications or herbal products.  Work-up on 01/01/2017 revealed a hematocrit of 34, hemoglobin 11.6, MCV 97.1, platelets 91,000, white count of 5200 with an ANC of 3300. Differential was normal.  Platelet count in a citrate tube was 83,000.  Normal studies included:  B12, folate, ANA, SPEP, free light chain ration, HIV testing, hepatitis B core antibody total.  Retic was 1.4%.  PT was 14.7 (INR 1.16) and PTT 39 (24-36).  TSH was 0.166 (0.35-4.5).   She has a mild normocytic anemia.  Hematocrit was 33.5, hemoglobin 11.4, and MCV 96 on 12/16/2016.  Iron saturation was 19% with a TIBC of 411 on  12/16/2016.  She has had a mildly elevated bilirubin since 03/28/2015.  Bilirubin was 2.1 (direct 0.6) on 12/06/2016.  Normal studies inlcuded: hepatitis A IgM, hepatitis B core IgM antibody, hepatitis B surface antigen, hepatitis C antibody, copper level, and anti-mitochondrial antibody screen.  Abdominal ultrasound on 12/02/2011 revealed a spleen upper limits of normal (13.73 cm).  Abdominal ultrasound on 12/23/2016 revealed a heterogeneous echotexture with lobulated contour of the liver suggestive of cirrhosis.  There was gallbladder wall thickening and free fluid adjacent to the gallbladder which may be related to cirrhosis.  There was a 3 mm gallbladder polyp.    Liver MRI on 01/02/2017 revealed morphologic features of the liver compatible with cirrhosis.  There was stigmata of portal venous hypertension including splenomegaly, esophageal varices, and ascites.  Spleen measured 12.5 x 5.9 x 13 cm (volume 500 cm3).  There was an arterial phase enhancing structure in the posterior right lobe of liver considered an LI-3 lesion, indeterminate probability for [facility]. Recommendation was for followup imaging in 6 months to assess for any threshold growth or change in imaging characteristics of this structure.  She has a history of stage II right breast cancer in 1999 (no records available).  She underwent surgery alone followed by 5 years of tamoxifen.  Her sister had breast cancer in her early 32s.  Symptomatically, she denies any complaint.  She denies any bruising or bleeding.  Spleen tip is palpable.  Plan: 1.  Discuss work-up indicating underlying liver disease and mild splenomegaly resulting in mild thrombocytopenia.   2.  Discuss liver MRI- indeterminate lesion in posterior right lobe.  Check AFP.  Discuss reimaging in 6 months. 3.  Discuss follow-up with GI (Dr Gaylyn Cheers and Denice Paradise, NP) regarding cirrhosis and portal hypertension. 4.  Follow-up with Dr Astrid Divine re:  Low TSH. 5.  Labs  today:  ferritin, iron studies, free T4, AFP. 6.  RTC in 6 months for MD assessment and labs (CBC with diff, CMP, AFP).   Lequita Asal, MD  01/08/2017, 4:35 PM  I saw and evaluated the patient, participating in the key portions of the service and reviewing pertinent diagnostic studies and records.  I reviewed the nurse practitioner's note and agree with the findings and the plan.  The assessment and plan were discussed with the patient.  Several questions were asked by the patient and answered.   Nolon Stalls, MD 01/08/2017, 4:35 PM

## 2017-01-12 ENCOUNTER — Encounter: Payer: Self-pay | Admitting: Hematology and Oncology

## 2017-01-15 ENCOUNTER — Telehealth: Payer: Self-pay | Admitting: *Deleted

## 2017-01-15 NOTE — Telephone Encounter (Signed)
-----   Message from Karen Kitchens, NP sent at 01/14/2017  5:00 AM EST ----- Regarding: RE: Looking for AFP What did we figure out about this?  Gaspar Bidding ----- Message ----- From: Lequita Asal, MD Sent: 01/12/2017   5:20 PM To: Karen Kitchens, NP, Shirlean Kelly, RN Subject: Looking for AFP                                 Maybe we can add it to her next labs with her PCP.  M

## 2017-01-15 NOTE — Telephone Encounter (Signed)
Called Dr. Larry Sierras office and spoke to [place] to inquire when patient's next appointment is.  She states she will be seen early January.  Asked if AFP can be added to patient's labs and the results faxed to Dr. Mike Gip.  Caryl Pina states she will give information to Dr. Larry Sierras nurse.

## 2017-03-28 ENCOUNTER — Encounter: Payer: Self-pay | Admitting: *Deleted

## 2017-03-31 ENCOUNTER — Ambulatory Visit: Payer: Medicare PPO | Admitting: Certified Registered Nurse Anesthetist

## 2017-03-31 ENCOUNTER — Encounter
Admission: RE | Disposition: A | Discharge status: Home / Self Care | Payer: Self-pay | Source: Ambulatory Visit | Attending: Unknown Physician Specialty

## 2017-03-31 ENCOUNTER — Ambulatory Visit
Admission: RE | Admit: 2017-03-31 | Discharge: 2017-03-31 | Disposition: A | Discharge status: Home / Self Care | Payer: Medicare PPO | Source: Ambulatory Visit | Attending: Unknown Physician Specialty | Admitting: Unknown Physician Specialty

## 2017-03-31 ENCOUNTER — Encounter: Payer: Self-pay | Admitting: Certified Registered Nurse Anesthetist

## 2017-03-31 DIAGNOSIS — E559 Vitamin D deficiency, unspecified: Secondary | ICD-10-CM | POA: Diagnosis not present

## 2017-03-31 DIAGNOSIS — E785 Hyperlipidemia, unspecified: Secondary | ICD-10-CM | POA: Insufficient documentation

## 2017-03-31 DIAGNOSIS — Z79899 Other long term (current) drug therapy: Secondary | ICD-10-CM | POA: Diagnosis not present

## 2017-03-31 DIAGNOSIS — Z7984 Long term (current) use of oral hypoglycemic drugs: Secondary | ICD-10-CM | POA: Diagnosis not present

## 2017-03-31 DIAGNOSIS — I10 Essential (primary) hypertension: Secondary | ICD-10-CM | POA: Diagnosis not present

## 2017-03-31 DIAGNOSIS — K210 Gastro-esophageal reflux disease with esophagitis: Secondary | ICD-10-CM | POA: Insufficient documentation

## 2017-03-31 DIAGNOSIS — E119 Type 2 diabetes mellitus without complications: Secondary | ICD-10-CM | POA: Insufficient documentation

## 2017-03-31 DIAGNOSIS — I8500 Esophageal varices without bleeding: Secondary | ICD-10-CM | POA: Diagnosis present

## 2017-03-31 DIAGNOSIS — F329 Major depressive disorder, single episode, unspecified: Secondary | ICD-10-CM | POA: Insufficient documentation

## 2017-03-31 DIAGNOSIS — Z7982 Long term (current) use of aspirin: Secondary | ICD-10-CM | POA: Diagnosis not present

## 2017-03-31 HISTORY — PX: ESOPHAGOGASTRODUODENOSCOPY: SHX5428

## 2017-03-31 LAB — GLUCOSE, CAPILLARY: GLUCOSE-CAPILLARY: 105 mg/dL — AB (ref 65–99)

## 2017-03-31 SURGERY — EGD (ESOPHAGOGASTRODUODENOSCOPY)
Anesthesia: General

## 2017-03-31 MED ORDER — LIDOCAINE HCL (PF) 2 % IJ SOLN
INTRAMUSCULAR | Status: AC
  Filled 2017-03-31: qty 10

## 2017-03-31 MED ORDER — PROPOFOL 10 MG/ML IV BOLUS
INTRAVENOUS | Status: AC
  Filled 2017-03-31: qty 20

## 2017-03-31 MED ORDER — LIDOCAINE HCL (CARDIAC) 20 MG/ML IV SOLN
INTRAVENOUS | Status: DC | PRN
  Administered 2017-03-31: 100 mg via INTRAVENOUS

## 2017-03-31 MED ORDER — SODIUM CHLORIDE 0.9 % IV SOLN
INTRAVENOUS | Status: DC
  Administered 2017-03-31: 1000 mL via INTRAVENOUS

## 2017-03-31 MED ORDER — PROPOFOL 10 MG/ML IV BOLUS
INTRAVENOUS | Status: DC | PRN
  Administered 2017-03-31: 50 mg via INTRAVENOUS
  Administered 2017-03-31: 30 mg via INTRAVENOUS
  Administered 2017-03-31: 20 mg via INTRAVENOUS
  Administered 2017-03-31: 30 mg via INTRAVENOUS

## 2017-03-31 MED ORDER — BUTAMBEN-TETRACAINE-BENZOCAINE 2-2-14 % EX AERO
INHALATION_SPRAY | CUTANEOUS | Status: AC
  Filled 2017-03-31: qty 5

## 2017-03-31 MED ORDER — SODIUM CHLORIDE 0.9 % IV SOLN: INTRAVENOUS | Status: DC

## 2017-03-31 MED ORDER — BUTAMBEN-TETRACAINE-BENZOCAINE 2-2-14 % EX AERO
INHALATION_SPRAY | CUTANEOUS | Status: DC | PRN
  Administered 2017-03-31: 1 via TOPICAL

## 2017-03-31 NOTE — Transfer of Care (Signed)
Immediate Anesthesia Transfer of Care Note  Patient: Terri Collins  Procedure(s) Performed: ESOPHAGOGASTRODUODENOSCOPY (EGD) (N/A )  Patient Location: PACU and Endoscopy Unit  Anesthesia Type:General  Level of Consciousness: awake  Airway & Oxygen Therapy: Patient Spontanous Breathing  Post-op Assessment: Report given to RN  Post vital signs: stable  Last Vitals:  Vitals:   03/31/17 0850  BP: (!) 121/46  Pulse: 95  Resp: 16  Temp: (!) 35.7 C  SpO2: 100%    Last Pain:  Vitals:   03/31/17 0850  TempSrc: Tympanic         Complications: No apparent anesthesia complications

## 2017-03-31 NOTE — H&P (Signed)
Primary Care Physician:  Gayland Curry, MD Primary Gastroenterologist:  Dr. Vira Agar  Pre-Procedure History & Physical: HPI:  Terri Collins is a 72 y.o. female is here for an endoscopy.  This is for Barretts esophagus.   Past Medical History:  Diagnosis Date  . Anemia   . Arthritis   . Cancer [facility]) 1999   right breast  . Depression   . Diabetes mellitus without complication ([place])   . Hyperlipidemia   . Hypertension   . Osteoporosis   . Vertigo   . Vitamin D deficiency     Past Surgical History:  Procedure Laterality Date  . BREAST BIOPSY Left 2006   benign. stereo no clip  . BREAST SURGERY    . CATARACT EXTRACTION W/PHACO Left 08/20/2016   Procedure: CATARACT EXTRACTION PHACO AND INTRAOCULAR LENS PLACEMENT (IOC);  Surgeon: Birder Robson, MD;  Location: [facility] ORS;  Service: Ophthalmology;  Laterality: Left;  [place] 01:11.3 AP% 20.4 CDE 14.58 FLUID PACK LOT # [id number]  . CATARACT EXTRACTION W/PHACO Right 10/08/2016   Procedure: CATARACT EXTRACTION PHACO AND INTRAOCULAR LENS PLACEMENT (IOC);  Surgeon: Birder Robson, MD;  Location: [facility] ORS;  Service: Ophthalmology;  Laterality: Right;  [place]  01:10 AP% 19.5 CDE 13.70 fluid pack lot # [id number]  . COLONOSCOPY    . COLONOSCOPY WITH PROPOFOL N/A 08/05/2014   Procedure: COLONOSCOPY WITH PROPOFOL;  Surgeon: Manya Silvas, MD;  Location: [facility] ENDOSCOPY;  Service: Endoscopy;  Laterality: N/A;  . ESOPHAGOGASTRODUODENOSCOPY    . MASTECTOMY Right 1999    Prior to Admission medications   Medication Sig Start Date End Date Taking? Authorizing Provider  aspirin EC 81 MG tablet Take 81 mg by mouth daily. 03/22/15  Yes [provider]  atorvastatin (LIPITOR) 20 MG tablet Take 20 mg by mouth daily.   Yes [provider]  Cholecalciferol (VITAMIN D3) 2000 UNITS capsule Take 2,000 Units by mouth daily.   Yes [provider]  enalapril (VASOTEC) 10 MG tablet Take 20 mg by mouth daily.    Yes [provider]  glipiZIDE (GLUCOTROL) 5 MG tablet Take 5 mg by mouth daily before breakfast.   Yes [provider]  glucose blood test strip 1 each by Other route as needed for other. Use as instructed   Yes [provider]  hydrochlorothiazide (HYDRODIURIL) 25 MG tablet Take 25 mg by mouth daily.   Yes [provider]  metFORMIN (GLUCOPHAGE) 500 MG tablet Take 1,000 mg by mouth 2 (two) times daily with a meal.   Yes [provider]    Allergies as of 01/10/2017  . (No Known Allergies)    Family History  Problem Relation Age of Onset  . Breast cancer Sister   . Breast cancer Maternal Aunt     Social History   Socioeconomic History  . Marital status: Widowed    Spouse name: Not on file  . Number of children: Not on file  . Years of education: Not on file  . Highest education level: Not on file  Social Needs  . Financial resource strain: Not on file  . Food insecurity - worry: Not on file  . Food insecurity - inability: Not on file  . Transportation needs - medical: Not on file  . Transportation needs - non-medical: Not on file  Occupational History  . Not on file  Tobacco Use  . Smoking status: Never Smoker  . Smokeless tobacco: Never Used  Substance and Sexual Activity  . Alcohol use:  No  . Drug use: Not on file  . Sexual activity: Not on file  Other Topics Concern  . Not on file  Social History Narrative  . Not on file    Review of Systems: See HPI, otherwise negative ROS  Physical Exam: BP (!) 121/46   Pulse 95   Temp (!) 96.3 F (35.7 C) (Tympanic)   Resp 16   Ht 5\' 3"  (1.6 m)   Wt 51.7 kg (114 lb)   SpO2 100%   BMI 20.19 kg/m  General:   Alert,  pleasant and cooperative in NAD Head:  Normocephalic and atraumatic. Neck:  Supple; no masses or thyromegaly. Lungs:  Clear throughout to auscultation.    Heart:  Regular rate and rhythm. Abdomen:  Soft, nontender and nondistended. Normal bowel sounds, without guarding,  and without rebound.   Neurologic:  Alert and  oriented x4;  grossly normal neurologically.  Impression/Plan: [name] is here for an endoscopy to be performed for Barretts esophagus.  Risks, benefits, limitations, and alternatives regarding  endoscopy have been reviewed with the patient.  Questions have been answered.  All parties agreeable.   Gaylyn Cheers, MD  03/31/2017, 9:20 AM

## 2017-03-31 NOTE — Anesthesia Post-op Follow-up Note (Signed)
Anesthesia QCDR form completed.        

## 2017-03-31 NOTE — Anesthesia Preprocedure Evaluation (Signed)
Anesthesia Evaluation  Patient identified by MRN, date of birth, ID band Patient awake    Reviewed: Allergy & Precautions, NPO status , Patient's Chart, lab work & pertinent test results  History of Anesthesia Complications Negative for: history of anesthetic complications  Airway Mallampati: II  TM Distance: >3 FB Neck ROM: Full    Dental no notable dental hx.    Pulmonary neg pulmonary ROS, neg sleep apnea, neg COPD,    breath sounds clear to auscultation- rhonchi (-) wheezing      Cardiovascular hypertension, Pt. on medications (-) CAD, (-) Past MI and (-) Cardiac Stents  Rhythm:Regular Rate:Normal - Systolic murmurs and - Diastolic murmurs    Neuro/Psych PSYCHIATRIC DISORDERS Depression negative neurological ROS     GI/Hepatic negative GI ROS, Neg liver ROS,   Endo/Other  diabetes, Oral Hypoglycemic Agents  Renal/GU negative Renal ROS     Musculoskeletal  (+) Arthritis ,   Abdominal (+) - obese,   Peds  Hematology  (+) anemia ,   Anesthesia Other Findings Past Medical History: No date: Anemia No date: Arthritis 1999: Cancer ([place])     Comment:  right breast No date: Depression No date: Diabetes mellitus without complication (H[facility]) No date: Hyperlipidemia No date: Hypertension No date: Osteoporosis No date: Vitamin D deficiency   Reproductive/Obstetrics                             Anesthesia Physical  Anesthesia Plan  ASA: II  Anesthesia Plan: General   Post-op Pain Management:    Induction: Intravenous  PONV Risk Score and Plan: 2 and Propofol infusion  Airway Management Planned: Natural Airway and Nasal Cannula  Additional Equipment:   Intra-op Plan:   Post-operative Plan:   Informed Consent: I have reviewed the patients History and Physical, chart, labs and discussed the procedure including the risks, benefits and alternatives for the proposed anesthesia with  the patient or authorized representative who has indicated his/her understanding and acceptance.     Plan Discussed with: CRNA and Anesthesiologist  Anesthesia Plan Comments:         Anesthesia Quick Evaluation

## 2017-03-31 NOTE — Op Note (Signed)
[facility] Gastroenterology Patient Name: Terri Collins Procedure Date: 03/31/2017 9:19 AM MRN: [id number] Account #: [id number] Date of Birth: [birth year] Admit Type: Outpatient Age: 72 Room: [facility] ENDO ROOM 3 Gender: Female Note Status: Finalized Procedure:            Upper GI endoscopy Indications:          Follow-up of Barrett's esophagus Providers:            Manya Silvas, MD Medicines:            Propofol per Anesthesia, Cetacaine spray. Complications:        No immediate complications. Procedure:            Pre-Anesthesia Assessment:                       - After reviewing the risks and benefits, the patient                        was deemed in satisfactory condition to undergo the                        procedure.                       After obtaining informed consent, the endoscope was                        passed under direct vision. Throughout the procedure,                        the patient's blood pressure, pulse, and oxygen                        saturations were monitored continuously. The Endoscope                        was introduced through the mouth, and advanced to the                        second part of duodenum. The upper GI endoscopy was                        somewhat difficult due to coughing. Successful                        completion of the procedure was aided by Cetacaine                        spray. Findings:      Non-bleeding grade 1 varices were found in the lower third of the       esophagus,. No stigmata of recent bleeding were evident and no red wale       signs were present.      LA Grade B-C (one or more mucosal breaks greater than 5 mm, not       extending between the tops of two mucosal folds) esophagitis with no       bleeding was found 39 cm from the incisors. Due to cirrhosis and varices       no biopsies were done.      Questionable Barretts esophagus seen in distal esophagus.  Diffuse mildly  erythematous mucosa without bleeding was found in the       gastric body.      The examined duodenum was normal. Impression:           - Non-bleeding grade II esophageal varices.                       - LA Grade B reflux esophagitis. Rule out Barrett's                        esophagus.                       - Erythematous mucosa in the gastric body.                       - Normal examined duodenum.                       - No specimens collected. Recommendation:       - The findings and recommendations were discussed with                        the patient's family. And the patient. Manya Silvas, MD 03/31/2017 9:50:29 AM This report has been signed electronically. Number of Addenda: 0 Note Initiated On: 03/31/2017 9:19 AM      [facility]

## 2017-03-31 NOTE — Anesthesia Postprocedure Evaluation (Signed)
Anesthesia Post Note  Patient: Terri Collins  Procedure(s) Performed: ESOPHAGOGASTRODUODENOSCOPY (EGD) (N/A )  Patient location during evaluation: Endoscopy Anesthesia Type: General Level of consciousness: awake and alert Pain management: pain level controlled Vital Signs Assessment: post-procedure vital signs reviewed and stable Respiratory status: spontaneous breathing, nonlabored ventilation, respiratory function stable and patient connected to nasal cannula oxygen Cardiovascular status: blood pressure returned to baseline and stable Postop Assessment: no apparent nausea or vomiting Anesthetic complications: no     Last Vitals:  Vitals:   03/31/17 1009 03/31/17 1019  BP: 107/63 (!) 124/54  Pulse: 100 100  Resp: 19 (!) 23  Temp:    SpO2: 95% 92%    Last Pain:  Vitals:   03/31/17 0949  TempSrc: Tympanic                 Martha Clan

## 2017-04-01 ENCOUNTER — Encounter: Payer: Self-pay | Admitting: Unknown Physician Specialty

## 2017-05-31 ENCOUNTER — Ambulatory Visit
Admission: EM | Admit: 2017-05-31 | Discharge: 2017-05-31 | Disposition: A | Discharge status: Home / Self Care | Payer: Medicare PPO | Attending: Family Medicine | Admitting: Family Medicine

## 2017-05-31 DIAGNOSIS — R188 Other ascites: Secondary | ICD-10-CM | POA: Diagnosis not present

## 2017-05-31 DIAGNOSIS — K7460 Unspecified cirrhosis of liver: Secondary | ICD-10-CM

## 2017-05-31 DIAGNOSIS — R140 Abdominal distension (gaseous): Secondary | ICD-10-CM

## 2017-05-31 NOTE — ED Triage Notes (Signed)
Pt states she has had diarrhea since march since she was told to start on Prilosec by her GI doctor. Also complains of abdominal bloating. Stopped all her Gi medications (probiotic, activia and Prilosec) states it's hard to catch her breath due to the bloating and ankle swelling. Has a hx of cirrhosis of liver, polyp and spot on her liver their unsure of. Does have a f/u with pcp later this month and f/u with GI in June.

## 2017-05-31 NOTE — Discharge Instructions (Signed)
Recommend follow up with Gastroenterologist on Monday (in 2 days) for re-evaluation Go to Emergency Department if any fevers or abdominal pain

## 2017-05-31 NOTE — ED Provider Notes (Signed)
[facility]    CSN: [id number] Arrival date & time: 05/31/17  1520     History   Chief Complaint Chief Complaint  Patient presents with  . Diarrhea    wants lindsey    HPI Terri Collins is a 72 y.o. female.   72 yo female with a h/o recently diagnosed cirrhosis of the liver (unknown etiology?) with a c/o progressively worsening abdominal bloating for the past 2 weeks and feet swelling for the past 2 days. Denies any pain, fevers, chills, vomiting, shortness of breath.   The history is provided by the patient.    Past Medical History:  Diagnosis Date  . Anemia   . Arthritis   . Cancer [facility]) 1999   right breast  . Depression   . Diabetes mellitus without complication ([place])   . Hyperlipidemia   . Hypertension   . Osteoporosis   . Vertigo   . Vitamin D deficiency     Patient Active Problem List   Diagnosis Date Noted  . Splenomegaly 01/08/2017  . Cirrhosis of liver with ascites ([place]) 01/08/2017  . 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 Thrombocytopenia ([place]) 01/01/2017  . Anemia 01/01/2017    Past Surgical History:  Procedure Laterality Date  . BREAST BIOPSY Left 2006   benign. stereo no clip  . BREAST SURGERY    . CATARACT EXTRACTION W/PHACO Left 08/20/2016   Procedure: CATARACT EXTRACTION PHACO AND INTRAOCULAR LENS PLACEMENT (IOC);  Surgeon: Birder Robson, MD;  Location: [facility] ORS;  Service: Ophthalmology;  Laterality: Left;  [place] 01:11.3 AP% 20.4 CDE 14.58 FLUID PACK LOT # [id number]  . CATARACT EXTRACTION W/PHACO Right 10/08/2016   Procedure: CATARACT EXTRACTION PHACO AND INTRAOCULAR LENS PLACEMENT (IOC);  Surgeon: Birder Robson, MD;  Location: [facility] ORS;  Service: Ophthalmology;  Laterality: Right;  [place]  01:10 AP% 19.5 CDE 13.70 fluid pack lot # [id number]  . COLONOSCOPY    . COLONOSCOPY WITH PROPOFOL N/A 08/05/2014   Procedure: COLONOSCOPY WITH PROPOFOL;  Surgeon: Manya Silvas, MD;  Location: [facility] ENDOSCOPY;  Service:  Endoscopy;  Laterality: N/A;  . ESOPHAGOGASTRODUODENOSCOPY    . ESOPHAGOGASTRODUODENOSCOPY N/A 03/31/2017   Procedure: ESOPHAGOGASTRODUODENOSCOPY (EGD);  Surgeon: Manya Silvas, MD;  Location: [facility] ENDOSCOPY;  Service: Endoscopy;  Laterality: N/A;  . MASTECTOMY Right 1999    OB History   None      Home Medications    Prior to Admission medications   Medication Sig Start Date End Date Taking? Authorizing Provider  aspirin EC 81 MG tablet Take 81 mg by mouth daily. 03/22/15  Yes [provider]  atorvastatin (LIPITOR) 20 MG tablet Take 20 mg by mouth daily.   Yes [provider]  Cholecalciferol (VITAMIN D3) 2000 UNITS capsule Take 2,000 Units by mouth daily.   Yes [provider]  enalapril (VASOTEC) 10 MG tablet Take 20 mg by mouth daily.    Yes [provider]  glipiZIDE (GLUCOTROL) 5 MG tablet Take 5 mg by mouth daily before breakfast.   Yes [provider]  glucose blood test strip 1 each by Other route as needed for other. Use as instructed   Yes [provider]  hydrochlorothiazide (HYDRODIURIL) 25 MG tablet Take 25 mg by mouth daily.   Yes [provider]  metFORMIN (GLUCOPHAGE) 500 MG tablet Take 1,000 mg by mouth 2 (two) times daily with a meal.   Yes [provider]    Family History Family History  Problem Relation Age of  Onset  . Breast cancer Sister   . Breast cancer Maternal Aunt     Social History Social History   Tobacco Use  . Smoking status: Never Smoker  . Smokeless tobacco: Never Used  Substance Use Topics  . Alcohol use: No  . Drug use: Not on file     Allergies   Patient has no known allergies.   Review of Systems Review of Systems   Physical Exam Triage Vital Signs ED Triage Vitals  Enc Vitals Group     BP 05/31/17 1546 (!) 129/55     Pulse Rate 05/31/17 1546 (!) 101     Resp 05/31/17 1546 18     Temp 05/31/17 1546 99 F (37.2 C)     Temp Source 05/31/17 1546  Oral     SpO2 05/31/17 1546 96 %     Weight --      Height --      Head Circumference --      Peak Flow --      Pain Score 05/31/17 1550 0     Pain Loc --      Pain Edu? --      Excl. in [place]? --    No data found.  Updated Vital Signs BP (!) 129/55 (BP Location: Right Arm)   Pulse (!) 101   Temp 99 F (37.2 C) (Oral)   Resp 18   SpO2 96%   Visual Acuity Right Eye Distance:   Left Eye Distance:   Bilateral Distance:    Right Eye Near:   Left Eye Near:    Bilateral Near:     Physical Exam  Constitutional: She appears well-developed and well-nourished. No distress.  Cardiovascular: Normal rate, regular rhythm and normal heart sounds.  Pulmonary/Chest: Effort normal and breath sounds normal. No stridor. No respiratory distress. She has no wheezes. She has no rales.  Abdominal: Soft. Bowel sounds are normal. She exhibits distension. She exhibits no mass. There is no tenderness. There is no rebound and no guarding.  Musculoskeletal: She exhibits edema (1+ pedal).  Skin: She is not diaphoretic.  Nursing note and vitals reviewed.    UC Treatments / Results  Labs (all labs ordered are listed, but only abnormal results are displayed) Labs Reviewed - No data to display  EKG None  Radiology No results found.  Procedures Procedures (including critical care time)  Medications Ordered in UC Medications - No data to display  Initial Impression / Assessment and Plan / UC Course  I have reviewed the triage vital signs and the nursing notes.  Pertinent labs & imaging results that were available during my care of the patient were reviewed by me and considered in my medical decision making (see chart for details).      Final Clinical Impressions(s) / UC Diagnoses   Final diagnoses:  Cirrhosis of liver with ascites, unspecified hepatic cirrhosis type ([place])  Abdominal bloating  Other ascites     Discharge Instructions     Recommend follow up with Gastroenterologist  on Monday (in 2 days) for re-evaluation Go to Emergency Department if any fevers or abdominal pain    ED Prescriptions    None     1. diagnosis reviewed with patient and daughter 2. Recommend supportive treatment with sodium restriction (discussed with patient) 3. Follow-up with Gastroenterologist in 2 days (Monday) 4. Go to Emergency Department if develops any fevers and abdominal pain 5. F/u prn   Controlled Substance Prescriptions [place] Controlled Substance Registry consulted?  Not Applicable   Norval Gable, MD 05/31/17 [phone]

## 2017-06-03 ENCOUNTER — Other Ambulatory Visit: Payer: Self-pay | Admitting: Student

## 2017-06-03 DIAGNOSIS — R1900 Intra-abdominal and pelvic swelling, mass and lump, unspecified site: Secondary | ICD-10-CM

## 2017-06-03 DIAGNOSIS — K7469 Other cirrhosis of liver: Secondary | ICD-10-CM

## 2017-06-06 ENCOUNTER — Ambulatory Visit
Admission: RE | Admit: 2017-06-06 | Discharge: 2017-06-06 | Disposition: A | Discharge status: Home / Self Care | Payer: Medicare PPO | Source: Ambulatory Visit | Attending: Student | Admitting: Student

## 2017-06-06 DIAGNOSIS — K7469 Other cirrhosis of liver: Secondary | ICD-10-CM | POA: Diagnosis not present

## 2017-06-06 DIAGNOSIS — R188 Other ascites: Secondary | ICD-10-CM | POA: Insufficient documentation

## 2017-06-06 DIAGNOSIS — R1900 Intra-abdominal and pelvic swelling, mass and lump, unspecified site: Secondary | ICD-10-CM

## 2017-06-06 LAB — ALBUMIN, PLEURAL OR PERITONEAL FLUID

## 2017-06-06 LAB — BODY FLUID CELL COUNT WITH DIFFERENTIAL
Eos, Fluid: 0 %
LYMPHS FL: 37 %
Monocyte-Macrophage-Serous Fluid: 61 %
NEUTROPHIL FLUID: 2 %
Other Cells, Fluid: 0 %
Total Nucleated Cell Count, Fluid: 90 cu mm

## 2017-06-06 IMAGING — US US PARACENTESIS
1 series · 5 of 5 positions shown · non-contrast
Comparison: none

INDICATION: Cirrhosis and ascites.

[Series 1: us paracentesis · 5 of 5 slices shown]
[im 1/5]
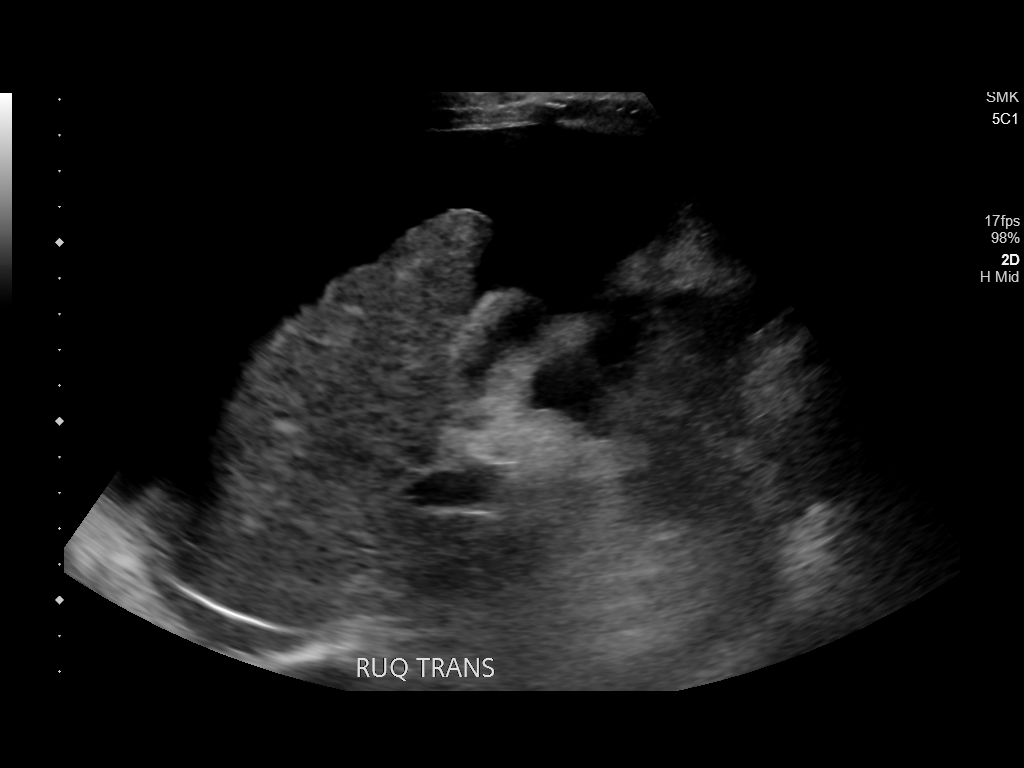
[im 2/5]
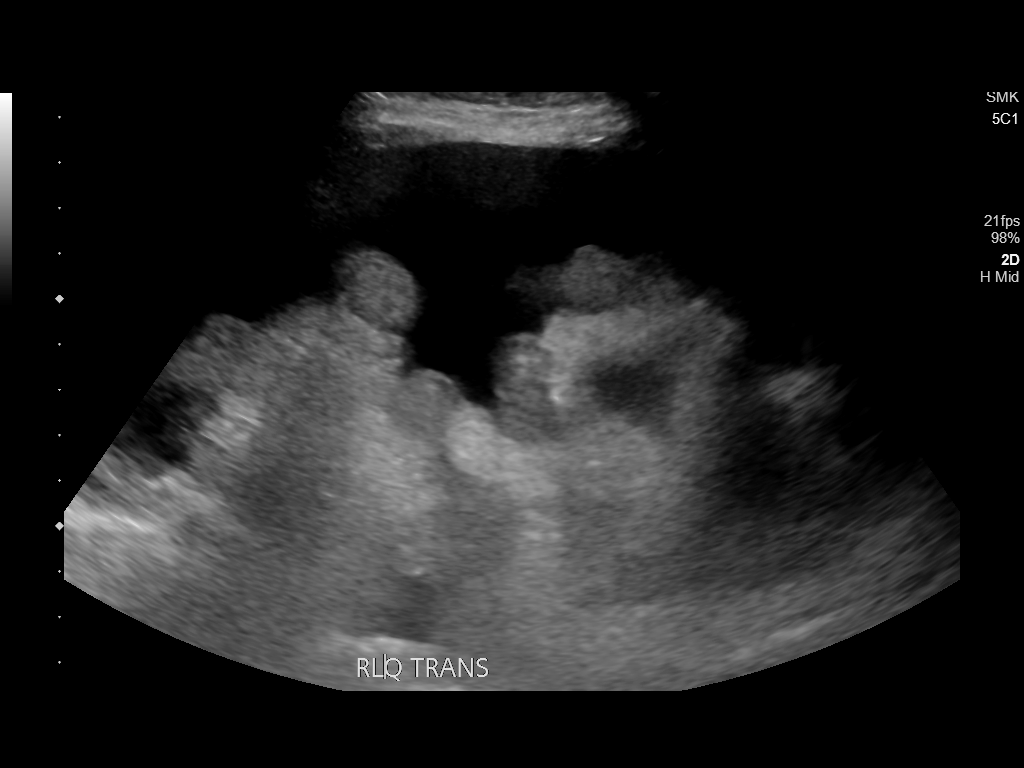
[im 3/5]
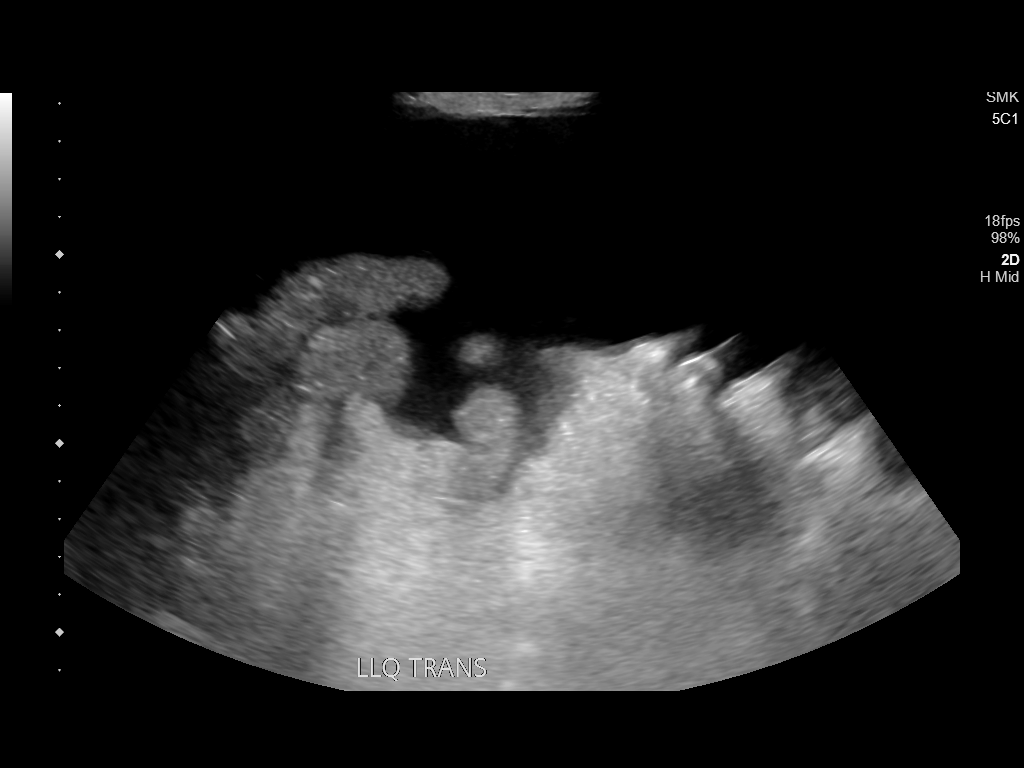
[im 4/5]
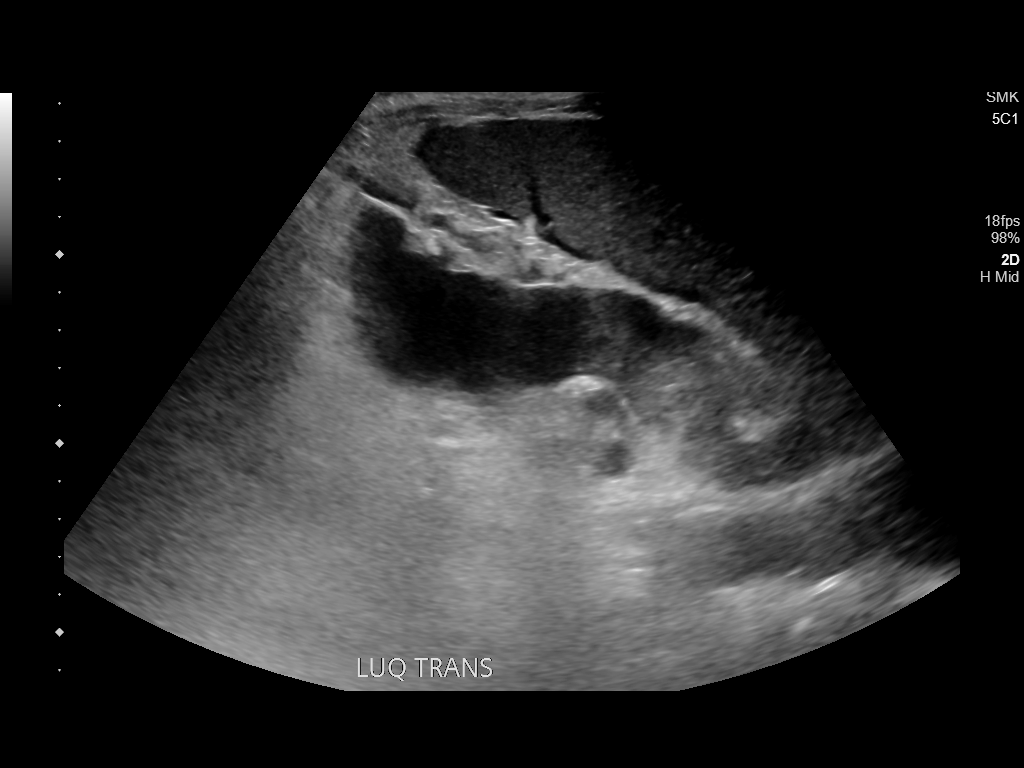
[im 5/5]
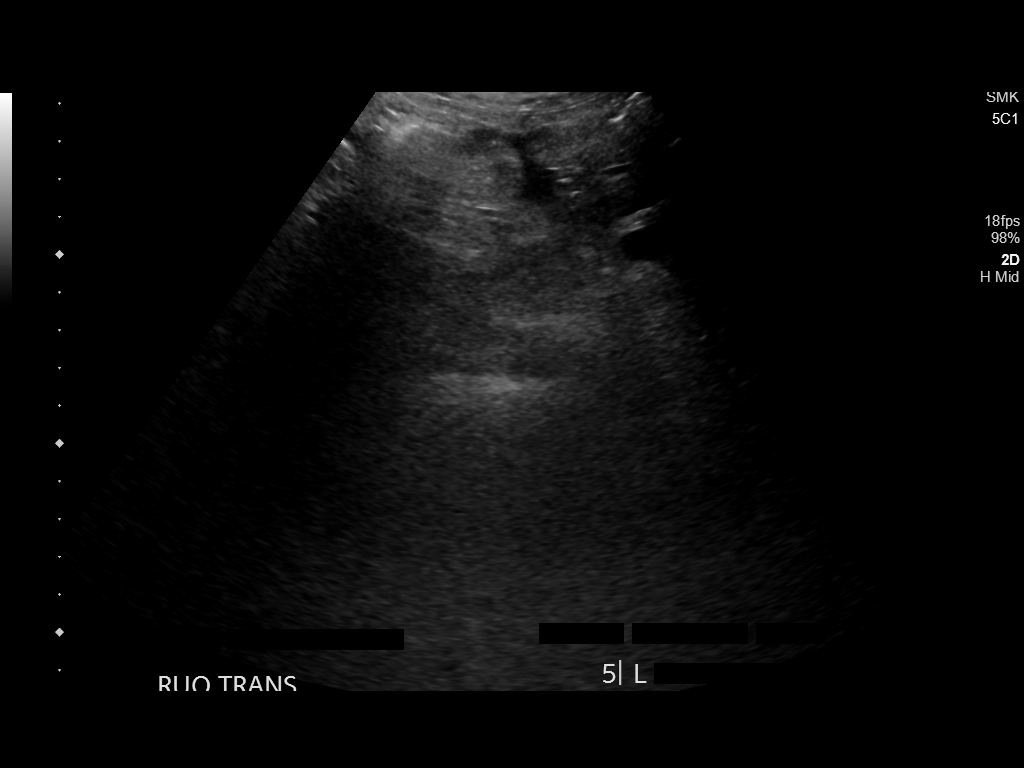

[5 of 5 positions shown; findings below may reference images not displayed]

EXAM:
ULTRASOUND GUIDED PARACENTESIS

MEDICATIONS:
None.

COMPLICATIONS:
None immediate.

PROCEDURE:
Informed written consent was obtained from the patient after a
discussion of the risks, benefits and alternatives to treatment. A
timeout was performed prior to the initiation of the procedure.

Initial ultrasound was performed to localize ascites. The right mid
lateral abdominal wall was prepped and draped in the usual sterile
fashion. 1% lidocaine was used for local anesthesia.

Following this, a 6 Fr Safe-T-Centesis catheter was introduced. An
ultrasound image was saved for documentation purposes. The
paracentesis was performed. The catheter was removed and a dressing
was applied. The patient tolerated the procedure well without
immediate post procedural complication.
FINDINGS: A total of approximately 5 L of clear, yellow fluid was removed.
Samples were sent to the laboratory as requested by the clinical
team.
IMPRESSION: Successful ultrasound-guided paracentesis yielding 5 liters of
peritoneal fluid.

## 2017-06-06 MED ORDER — ALBUMIN HUMAN 25 % IV SOLN
INTRAVENOUS | Status: AC
  Administered 2017-06-06: 25 g
  Filled 2017-06-06: qty 200

## 2017-06-09 LAB — CYTOLOGY - NON PAP

## 2017-06-10 LAB — BODY FLUID CULTURE: CULTURE: NO GROWTH

## 2017-07-09 ENCOUNTER — Inpatient Hospital Stay: Payer: Medicare PPO | Attending: Hematology and Oncology | Admitting: Hematology and Oncology

## 2017-07-09 ENCOUNTER — Inpatient Hospital Stay: Payer: Medicare PPO

## 2017-07-09 ENCOUNTER — Encounter: Payer: Self-pay | Admitting: Hematology and Oncology

## 2017-07-09 VITALS — BP 114/64 | HR 80 | Temp 97.9°F | Resp 20 | Ht 63.0 in | Wt 112.5 lb

## 2017-07-09 DIAGNOSIS — K7460 Unspecified cirrhosis of liver: Secondary | ICD-10-CM | POA: Diagnosis not present

## 2017-07-09 DIAGNOSIS — D649 Anemia, unspecified: Secondary | ICD-10-CM

## 2017-07-09 DIAGNOSIS — Z853 Personal history of malignant neoplasm of breast: Secondary | ICD-10-CM | POA: Insufficient documentation

## 2017-07-09 DIAGNOSIS — D696 Thrombocytopenia, unspecified: Secondary | ICD-10-CM | POA: Diagnosis not present

## 2017-07-09 DIAGNOSIS — K769 Liver disease, unspecified: Secondary | ICD-10-CM | POA: Diagnosis not present

## 2017-07-09 DIAGNOSIS — R161 Splenomegaly, not elsewhere classified: Secondary | ICD-10-CM

## 2017-07-09 DIAGNOSIS — Z803 Family history of malignant neoplasm of breast: Secondary | ICD-10-CM | POA: Insufficient documentation

## 2017-07-09 DIAGNOSIS — R188 Other ascites: Secondary | ICD-10-CM

## 2017-07-09 LAB — CBC WITH DIFFERENTIAL/PLATELET
Basophils Absolute: 0.1 10*3/uL (ref 0–0.1)
Basophils Relative: 2 %
Eosinophils Absolute: 0.3 10*3/uL (ref 0–0.7)
Eosinophils Relative: 4 %
HCT: 31 % — ABNORMAL LOW (ref 35.0–47.0)
Hemoglobin: 10.5 g/dL — ABNORMAL LOW (ref 12.0–16.0)
Lymphocytes Relative: 12 %
Lymphs Abs: 0.7 10*3/uL — ABNORMAL LOW (ref 1.0–3.6)
MCH: 32 pg (ref 26.0–34.0)
MCHC: 34 g/dL (ref 32.0–36.0)
MCV: 94.3 fL (ref 80.0–100.0)
Monocytes Absolute: 0.5 10*3/uL (ref 0.2–0.9)
Monocytes Relative: 9 %
Neutro Abs: 4.5 10*3/uL (ref 1.4–6.5)
Neutrophils Relative %: 73 %
Platelets: 123 10*3/uL — ABNORMAL LOW (ref 150–440)
RBC: 3.29 MIL/uL — ABNORMAL LOW (ref 3.80–5.20)
RDW: 15 % — ABNORMAL HIGH (ref 11.5–14.5)
WBC: 6.1 10*3/uL (ref 3.6–11.0)

## 2017-07-09 LAB — COMPREHENSIVE METABOLIC PANEL
ALT: 18 U/L (ref 14–54)
AST: 39 U/L (ref 15–41)
Albumin: 3 g/dL — ABNORMAL LOW (ref 3.5–5.0)
Alkaline Phosphatase: 98 U/L (ref 38–126)
Anion gap: 9 (ref 5–15)
BUN: 18 mg/dL (ref 6–20)
CO2: 24 mmol/L (ref 22–32)
Calcium: 8.5 mg/dL — ABNORMAL LOW (ref 8.9–10.3)
Chloride: 103 mmol/L (ref 101–111)
Creatinine, Ser: 0.88 mg/dL (ref 0.44–1.00)
GFR calc Af Amer: >60 mL/min (ref >60)
GFR calc non Af Amer: >60 mL/min (ref >60)
Glucose, Bld: 280 mg/dL — ABNORMAL HIGH (ref 65–99)
Potassium: 3.5 mmol/L (ref 3.5–5.1)
Sodium: 136 mmol/L (ref 135–145)
Total Bilirubin: 2.1 mg/dL — ABNORMAL HIGH (ref 0.3–1.2)
Total Protein: 6.3 g/dL — ABNORMAL LOW (ref 6.5–8.1)

## 2017-07-09 LAB — IRON AND TIBC
Iron: 84 ug/dL (ref 28–170)
Saturation Ratios: 27 % (ref 10.4–31.8)
TIBC: 312 ug/dL (ref 250–450)
UIBC: 228 ug/dL

## 2017-07-09 LAB — FERRITIN: Ferritin: 47 ng/mL (ref 11–307)

## 2017-07-09 NOTE — Progress Notes (Signed)
[facility] day:  07/09/2017   Chief Complaint: Terri Collins is a 72 y.o. 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  HPI:  The patient was last seen in the hematology clinic on 01/08/2017.  At that time, work-up indicated underlying liver disease and mild splenomegaly resulting in mild thrombocytopenia.  She denied any complaint.  She denied any bruising or bleeding.  Spleen tip was palpable.  We discussed follow-up with GI.  We discussed follow-up imaging in 6 months.  AFP was 5.5 on 02/05/2017.  She underwent ultrasound guided paracentesis on 06/06/2017.  5L of clear yellow fluid was removed.  Cytology revealed inflammation and reactive mesothelial cells.  She followed up with the GI [facility] on 06/17/2017. She was noted to have recently started Lasix and aldactone.  She had diarrhea and esophagitis.  Stool studies were negative.  She was switched to carafate.  During the interim, she notes that she has been "hanging in there".  She is breathing better.  Weight is down 10 pounds after fluid removed.  Omeprazole caused diarrhea.  Her strength and appetite are improving.   Past Medical History:  Diagnosis Date  . Anemia   . Arthritis   . Cancer [facility]) 1999   right breast  . Depression   . Diabetes mellitus without complication ([place])   . Hyperlipidemia   . Hypertension   . Osteoporosis   . Vertigo   . Vitamin D deficiency     Past Surgical History:  Procedure Laterality Date  . BREAST BIOPSY Left 2006   benign. stereo no clip  . BREAST SURGERY    . CATARACT EXTRACTION W/PHACO Left 08/20/2016   Procedure: CATARACT EXTRACTION PHACO AND INTRAOCULAR LENS PLACEMENT (IOC);  Surgeon: Birder Robson, MD;  Location: [facility] ORS;  Service: Ophthalmology;  Laterality: Left;  [place] 01:11.3 AP% 20.4 CDE 14.58 FLUID PACK LOT # [id number]  . CATARACT EXTRACTION W/PHACO Right  10/08/2016   Procedure: CATARACT EXTRACTION PHACO AND INTRAOCULAR LENS PLACEMENT (IOC);  Surgeon: Birder Robson, MD;  Location: [facility] ORS;  Service: Ophthalmology;  Laterality: Right;  [place]  01:10 AP% 19.5 CDE 13.70 fluid pack lot # [id number]  . COLONOSCOPY    . COLONOSCOPY WITH PROPOFOL N/A 08/05/2014   Procedure: COLONOSCOPY WITH PROPOFOL;  Surgeon: Manya Silvas, MD;  Location: [facility] ENDOSCOPY;  Service: Endoscopy;  Laterality: N/A;  . ESOPHAGOGASTRODUODENOSCOPY    . ESOPHAGOGASTRODUODENOSCOPY N/A 03/31/2017   Procedure: ESOPHAGOGASTRODUODENOSCOPY (EGD);  Surgeon: Manya Silvas, MD;  Location: [facility] ENDOSCOPY;  Service: Endoscopy;  Laterality: N/A;  . MASTECTOMY Right 1999    Family History  Problem Relation Age of Onset  . Breast cancer Sister   . Breast cancer Maternal Aunt     Social History:  reports that she has never smoked. She has never used smokeless tobacco. She reports that she does not drink alcohol. Her drug history is not on file.   She raised tobacco, but never smoked.  She drinks sangria on special occasions.  She worked at [employer] for 20 years then [employer].  Her husband died 3 years ago.  She has 2 daughters.  She lives in [place].  The patient is alone today.  Allergies: No Known Allergies  Current Medications: Current Outpatient Medications  Medication Sig Dispense Refill  . aspirin EC 81 MG tablet Take 81 mg by mouth daily.    Marland Kitchen atorvastatin (LIPITOR) 20 MG  tablet Take 20 mg by mouth daily.    . Cholecalciferol (VITAMIN D3) 2000 UNITS capsule Take 2,000 Units by mouth daily.    . enalapril (VASOTEC) 10 MG tablet Take 20 mg by mouth daily.     . furosemide (LASIX) 20 MG tablet Take 1 tablet by mouth daily.    Marland Kitchen glipiZIDE (GLUCOTROL) 5 MG tablet Take 5 mg by mouth daily before breakfast.    . glucose blood test strip 1 each by Other route as needed for other. Use as instructed    . metFORMIN (GLUCOPHAGE) 500 MG tablet Take 1,000 mg by mouth 2 (two) times  daily with a meal.    . spironolactone (ALDACTONE) 50 MG tablet Take 1 tablet by mouth every morning.    . sucralfate (CARAFATE) 1 g tablet Take 1 tablet by mouth 2 (two) times daily before a meal.  3   No current facility-administered medications for this visit.     Review of Systems:  GENERAL:  Feels "better".  Strength improving.  No fevers, sweats.  Weight down 6 pounds. PERFORMANCE STATUS (ECOG):  1 HEENT:  No visual changes, runny nose, sore throat, mouth sores or tenderness. Lungs: Breathing better.  No increased shortness of breath or cough.  No hemoptysis. Cardiac:  No chest pain, palpitations, orthopnea, or PND. GI:  Appetite better.  Fatty liver.  Interval paracentesis.  No nausea, vomiting, diarrhea, constipation, melena or hematochezia. GU:  No urgency, frequency, dysuria, or hematuria. Musculoskeletal:  No back pain.  No joint pain.  No muscle tenderness. Extremities:  No pain or swelling. Skin:  No rashes or skin changes. Neuro:  No headache, numbness or weakness, balance or coordination issues. Endocrine:  Diabetes.  No thyroid issues, hot flashes or night sweats. Psych:  No mood changes, depression or anxiety. Pain:  No focal pain. Review of systems:  All other systems reviewed and found to be negative.   Physical Exam: Blood pressure 114/64, pulse 80, temperature 97.9 F (36.6 C), temperature source Tympanic, resp. rate 20, height '5\' 3"'$  (1.6 m), weight 112 lb 8 oz (51 kg). GENERAL:  Thin woman sitting comfortably in the exam room in no acute distress. MENTAL STATUS:  Alert and oriented to person, place and time. HEAD:  Blonde hair.  Normocephalic, atraumatic, face symmetric, no Cushingoid features. EYES:  Blue eyes.  Right eye twitch.  Pupils equal round and reactive to light and accomodation.  No conjunctivitis or scleral icterus. ENT:  Oropharynx clear without lesion.  Tongue normal. Mucous membranes moist.  RESPIRATORY:  Clear to auscultation without rales,  wheezes or rhonchi. CARDIOVASCULAR:  Regular rate and rhythm without murmur, rub or gallop. ABDOMEN:  Soft, non-tender, with active bowel sounds, and no hepatosplenomegaly.  No masses. SKIN:  No rashes, ulcers or lesions. EXTREMITIES: No edema, no skin discoloration or tenderness.  No palpable cords. LYMPH NODES: No palpable cervical, supraclavicular, axillary or inguinal adenopathy  NEUROLOGICAL: Unremarkable. PSYCH:  Appropriate.    Appointment on 07/09/2017  Component Date Value Ref Range Status  . AFP, Serum, Tumor Marker 07/09/2017 3.4  0.0 - 8.3 ng/mL Final   Comment: (NOTE) Roche Diagnostics Electrochemiluminescence Immunoassay (ECLIA) Values obtained with different assay methods or kits cannot be used interchangeably.  Results cannot be interpreted as absolute evidence of the presence or absence of malignant disease. This test is not interpretable in pregnant females. Performed At: [facility] [place], [place] [id number] Rush Farmer MD VO:[id number] Performed at [facility], [phone]  [address] [place], [place] [address]   . Sodium 07/09/2017 136  135 - 145 mmol/L Final  . Potassium 07/09/2017 3.5  3.5 - 5.1 mmol/L Final  . Chloride 07/09/2017 103  101 - 111 mmol/L Final  . CO2 07/09/2017 24  22 - 32 mmol/L Final  . Glucose, Bld 07/09/2017 280* 65 - 99 mg/dL Final  . BUN 07/09/2017 18  6 - 20 mg/dL Final  . Creatinine, Ser 07/09/2017 0.88  0.44 - 1.00 mg/dL Final  . Calcium 07/09/2017 8.5* 8.9 - 10.3 mg/dL Final  . Total Protein 07/09/2017 6.3* 6.5 - 8.1 g/dL Final  . Albumin 07/09/2017 3.0* 3.5 - 5.0 g/dL Final  . AST 07/09/2017 39  15 - 41 U/L Final  . ALT 07/09/2017 18  14 - 54 U/L Final  . Alkaline Phosphatase 07/09/2017 98  38 - 126 U/L Final  . Total Bilirubin 07/09/2017 2.1* 0.3 - 1.2 mg/dL Final  . GFR calc non Af Amer 07/09/2017 >60  >60 mL/min Final  . GFR calc Af Amer 07/09/2017 >60  >60 mL/min Final   Comment:  (NOTE) The eGFR has been calculated using the CKD EPI equation. This calculation has not been validated in all clinical situations. eGFR's persistently <60 mL/min signify possible Chronic Kidney Disease.   Georgiann Hahn gap 07/09/2017 9  5 - 15 Final   Performed at [facility], [address] [place], [place] [address]  . WBC 07/09/2017 6.1  3.6 - 11.0 K/uL Final  . RBC 07/09/2017 3.29* 3.80 - 5.20 MIL/uL Final  . Hemoglobin 07/09/2017 10.5* 12.0 - 16.0 g/dL Final  . HCT 07/09/2017 31.0* 35.0 - 47.0 % Final  . MCV 07/09/2017 94.3  80.0 - 100.0 fL Final  . MCH 07/09/2017 32.0  26.0 - 34.0 pg Final  . MCHC 07/09/2017 34.0  32.0 - 36.0 g/dL Final  . RDW 07/09/2017 15.0* 11.5 - 14.5 % Final  . Platelets 07/09/2017 123* 150 - 440 K/uL Final  . Neutrophils Relative % 07/09/2017 73  % Final  . Neutro Abs 07/09/2017 4.5  1.4 - 6.5 K/uL Final  . Lymphocytes Relative 07/09/2017 12  % Final  . Lymphs Abs 07/09/2017 0.7* 1.0 - 3.6 K/uL Final  . Monocytes Relative 07/09/2017 9  % Final  . Monocytes Absolute 07/09/2017 0.5  0.2 - 0.9 K/uL Final  . Eosinophils Relative 07/09/2017 4  % Final  . Eosinophils Absolute 07/09/2017 0.3  0 - 0.7 K/uL Final  . Basophils Relative 07/09/2017 2  % Final  . Basophils Absolute 07/09/2017 0.1  0 - 0.1 K/uL Final   Performed at [facility], [address] [place], [place] [address]  . Iron 07/09/2017 84  28 - 170 ug/dL Final  . TIBC 07/09/2017 312  250 - 450 ug/dL Final  . Saturation Ratios 07/09/2017 27  10.4 - 31.8 % Final  . UIBC 07/09/2017 228  ug/dL Final   Performed at [facility], [address] [place], [place] [address]  . Ferritin 07/09/2017 47  11 - 307 ng/mL Final   Performed at [facility], [address] [place], [place] [address]    Assessment:  Terri Collins is a 72 y.o. 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  Platelet count has ranged between 76,000 -  122,000 without trend.  Platelets count was 76,000 on 12/05/2016 and 96,000 on 12/16/2016.  WBC and differential are unremarkable.  She denies any new medications or  herbal products.  Work-up on 01/01/2017 revealed a hematocrit of 34, hemoglobin 11.6, MCV 97.1, platelets 91,000, white count of 5200 with an ANC of 3300. Differential was normal.  Platelet count in a citrate tube was 83,000.  Normal studies included:  B12, folate, ANA, SPEP, free light chain ration, HIV testing, hepatitis B core antibody total.  Retic was 1.4%.  PT was 14.7 (INR 1.16) and PTT 39 (24-36).  TSH was 0.166 (0.35-4.5).   She has a mild normocytic anemia.  Hematocrit was 33.5, hemoglobin 11.4, and MCV 96 on 12/16/2016.  Iron saturation was 19% with a TIBC of 411 on 12/16/2016.  She has had a mildly elevated bilirubin since 03/28/2015.  Bilirubin was 2.1 (direct 0.6) on 12/06/2016.  Normal studies inlcuded: hepatitis A IgM, hepatitis B core IgM antibody, hepatitis B surface antigen, hepatitis C antibody, copper level, and anti-mitochondrial antibody screen.  Abdominal ultrasound on 12/02/2011 revealed a spleen upper limits of normal (13.73 cm).  Abdominal ultrasound on 12/23/2016 revealed a heterogeneous echotexture with lobulated contour of the liver suggestive of cirrhosis.  There was gallbladder wall thickening and free fluid adjacent to the gallbladder which may be related to cirrhosis.  There was a 3 mm gallbladder polyp.    Liver MRI on 01/02/2017 revealed morphologic features of the liver compatible with cirrhosis.  There was stigmata of portal venous hypertension including splenomegaly, esophageal varices, and ascites.  Spleen measured 12.5 x 5.9 x 13 cm (volume 500 cm3).  There was an arterial phase enhancing structure in the posterior right lobe of liver considered an LI-3 lesion, indeterminate probability for [facility]. Recommendation was for followup imaging in 6 months to assess for any threshold growth or change in imaging  characteristics of this structure.  Ultrasound guided paracentesis on 06/06/2017 revealed 5L of clear yellow fluid.  Cytology revealed inflammation and reactive mesothelial cells.  AFP was 5.5 on 02/05/2017 and 3.4 on 07/09/2017.  She has a history of stage II right breast cancer in 1999 (no records available).  She underwent surgery alone followed by 5 years of tamoxifen.  Her sister had breast cancer in her early 35s.  Symptomatically, she is feeling better.  Exam is stable.  Hemoglobin is 10.5.  Platelets are 123,000.  Ferritin is 47.  Iron saturation is 27%.  Plan: 1.  Labs today:  CBC with diff, CMP, AFP, ferritin, iron studies. 2.  Discuss stable platelet count- reflective of liver disease and mild splenomegaly. 3.  Follow-up with GI (Dr Gaylyn Cheers and Denice Paradise, NP) regarding cirrhosis and portal hypertension. 4.  RTC in 3 months (CBC with diff) 5.  RTC in 6 months for MD assessment and labs (CBC with diff, CMP, AFP).   Lequita Asal, MD  07/09/2017, 2:35 PM

## 2017-07-09 NOTE — Progress Notes (Signed)
Patient recently underwent upper endoscopy. Per patient, she had an unsettling cough during the procedure.  She was dx with esophagitis. Patient also had 5 liters of fluid withdrawn in paracentesis in May. She was given script for laxix 20 mg along with hydrochlorothiazide 50 mg to take daily.

## 2017-07-10 LAB — AFP TUMOR MARKER: AFP, Serum, Tumor Marker: 3.4 ng/mL (ref 0.0–8.3)

## 2017-07-24 ENCOUNTER — Other Ambulatory Visit: Payer: Self-pay | Admitting: Student

## 2017-07-24 DIAGNOSIS — K7460 Unspecified cirrhosis of liver: Secondary | ICD-10-CM

## 2017-07-24 DIAGNOSIS — R188 Other ascites: Principal | ICD-10-CM

## 2017-08-19 ENCOUNTER — Ambulatory Visit
Admission: RE | Admit: 2017-08-19 | Discharge: 2017-08-19 | Disposition: A | Discharge status: Home / Self Care | Payer: Medicare PPO | Source: Ambulatory Visit | Attending: Student | Admitting: Student

## 2017-08-19 DIAGNOSIS — R161 Splenomegaly, not elsewhere classified: Secondary | ICD-10-CM | POA: Diagnosis not present

## 2017-08-19 DIAGNOSIS — I8500 Esophageal varices without bleeding: Secondary | ICD-10-CM | POA: Diagnosis not present

## 2017-08-19 DIAGNOSIS — R932 Abnormal findings on diagnostic imaging of liver and biliary tract: Secondary | ICD-10-CM | POA: Insufficient documentation

## 2017-08-19 DIAGNOSIS — J90 Pleural effusion, not elsewhere classified: Secondary | ICD-10-CM | POA: Diagnosis not present

## 2017-08-19 DIAGNOSIS — K766 Portal hypertension: Secondary | ICD-10-CM | POA: Diagnosis not present

## 2017-08-19 DIAGNOSIS — K769 Liver disease, unspecified: Secondary | ICD-10-CM | POA: Diagnosis not present

## 2017-08-19 DIAGNOSIS — R188 Other ascites: Secondary | ICD-10-CM | POA: Insufficient documentation

## 2017-08-19 DIAGNOSIS — K7460 Unspecified cirrhosis of liver: Secondary | ICD-10-CM | POA: Insufficient documentation

## 2017-08-19 IMAGING — MR MR ABDOMEN WO/W CM
15 of 16 series · 43 of 48 positions shown · IV contrast (multihance)
Comparison: 01/02/2017

CLINICAL DATA: Cirrhosis.

EXAM:
MRI ABDOMEN WITHOUT AND WITH CONTRAST
TECHNIQUE: Multiplanar multisequence MR imaging of the abdomen was performed
both before and after the administration of intravenous contrast.
CONTRAST:  10mL MULTIHANCE GADOBENATE DIMEGLUMINE 529 MG/ML IV SOLN

[Series 2: cor ssfse / · coronal · 7.0mm · 1.48mm/px · 1 of 30 slices shown]
[im 1/30]
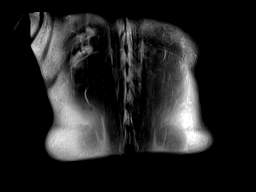

[Series 3: T2 fat-sat · axial · 6.0mm · 1.48mm/px · 1 of 30 slices shown]
[im 1/30]
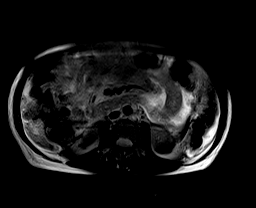

[Series 4: T1 · axial · 6.0mm · 0.74mm/px · z∈[-44,+165]mm · 2 of 60 slices shown]
[im 1/60]
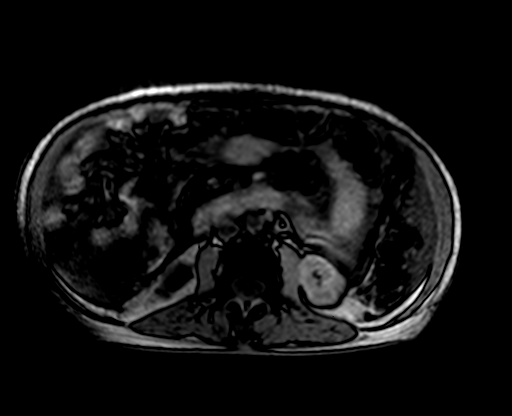
[im 60/60]
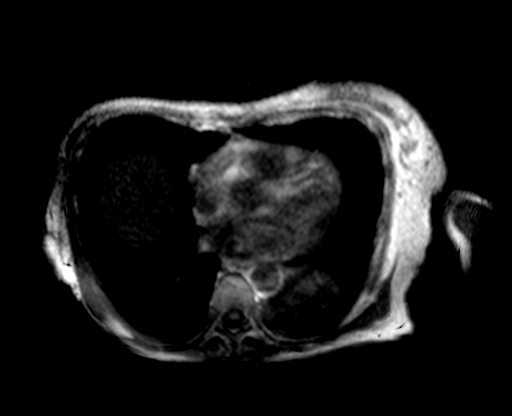

[Series 6: DWI · axial · 6.0mm · 2.00mm/px · 1 of 30 slices shown]
[im 1/30]
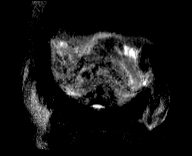

[Series 7: bSSFP · axial · 6.0mm · 0.74mm/px · 1 of 25 slices shown]
[im 1/25]
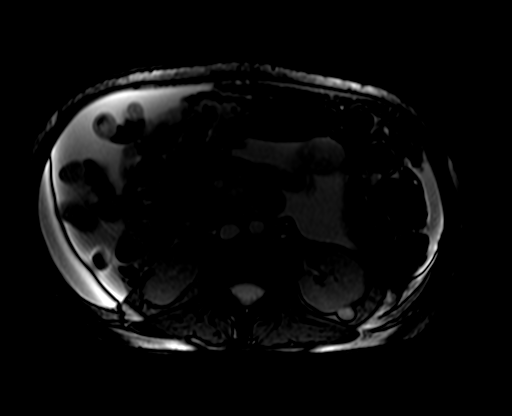

[Series 8: axial dynamic pre · axial · non-contrast · 3.0mm · 1.19mm/px · z∈[-44,+145]mm · 3 of 64 slices shown]
[im 1/64]
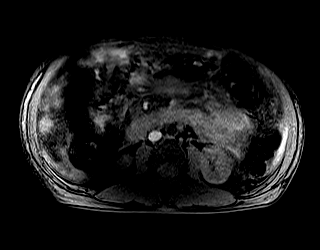
[im 32/64]
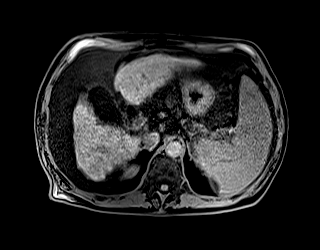
[im 64/64]
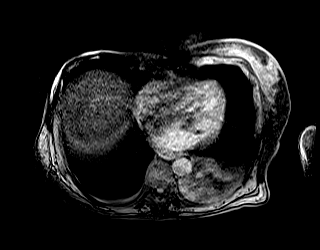

[Series 9: axial dynamic post · axial · 3.0mm · 1.19mm/px · z∈[-44,+145]mm · 4 of 64 slices shown (1 of 6)]
[im 1/64]
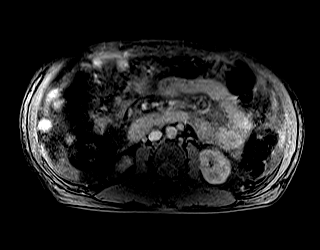
[im 22/64]
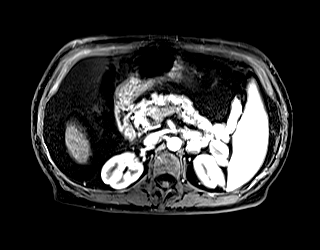
[im 43/64]
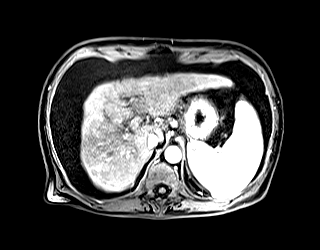
[im 64/64]
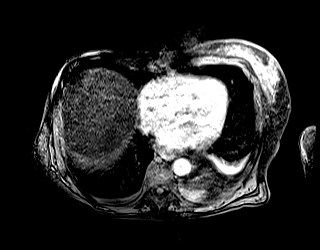

[Series 10: axial dynamic post · axial · 3.0mm · 1.19mm/px · z∈[-44,+145]mm · 4 of 64 slices shown (2 of 6)]
[im 1/64]
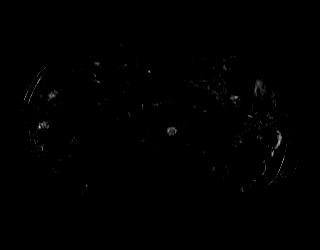
[im 22/64]
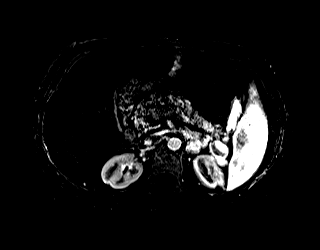
[im 43/64]
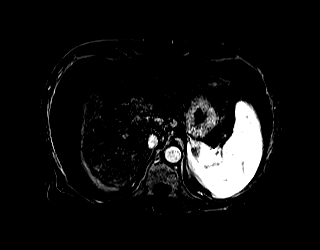
[im 64/64]
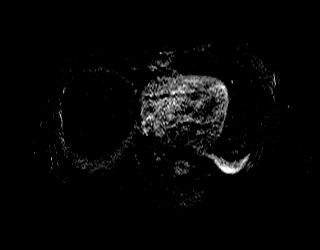

[Series 11: axial dynamic post · axial · 3.0mm · 1.19mm/px · z∈[-44,+145]mm · 4 of 64 slices shown (3 of 6)]
[im 1/64]
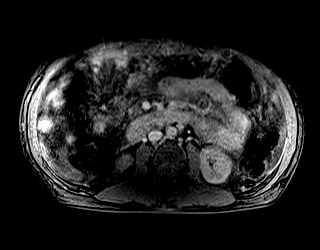
[im 22/64]
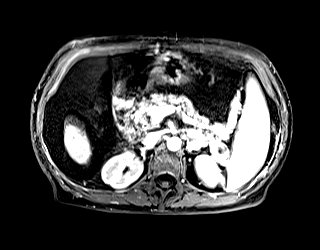
[im 43/64]
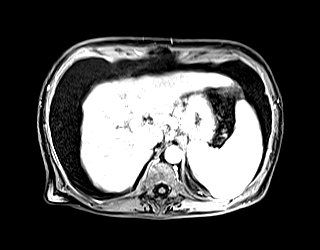
[im 64/64]
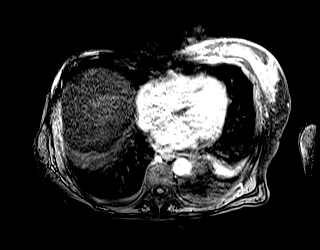

[Series 12: axial dynamic post · axial · 3.0mm · 1.19mm/px · z∈[-44,+145]mm · 4 of 64 slices shown (4 of 6)]
[im 1/64]
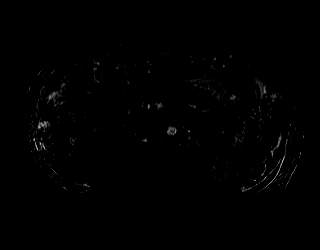
[im 22/64]
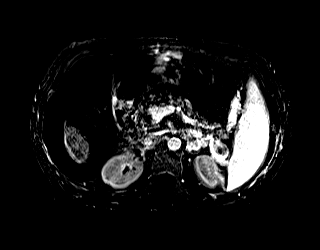
[im 43/64]
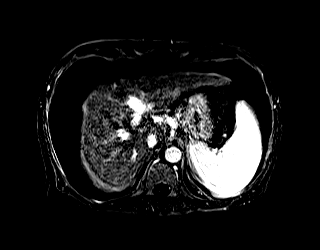
[im 64/64]
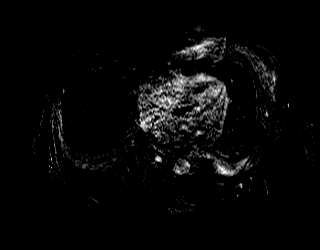

[Series 13: axial dynamic post · axial · 3.0mm · 1.19mm/px · z∈[-44,+145]mm · 4 of 64 slices shown (5 of 6)]
[im 1/64]
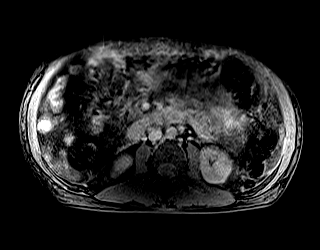
[im 22/64]
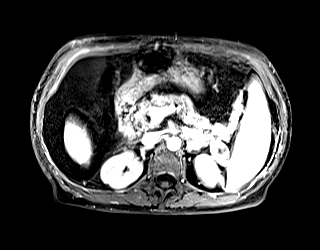
[im 43/64]
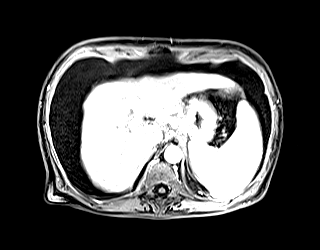
[im 64/64]
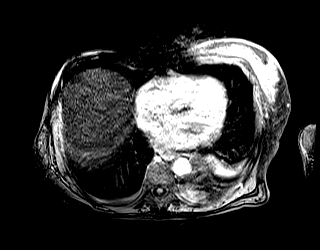

[Series 14: axial dynamic post · axial · 3.0mm · 1.19mm/px · z∈[-44,+145]mm · 4 of 64 slices shown (6 of 6)]
[im 1/64]
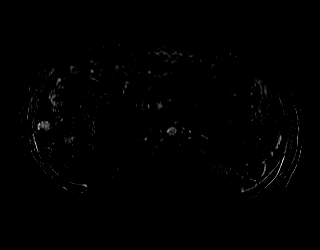
[im 22/64]
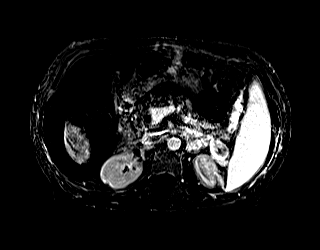
[im 43/64]
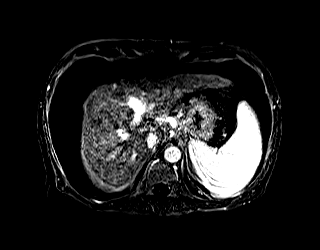
[im 64/64]
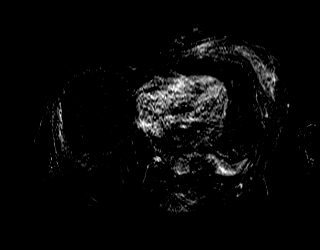

[Series 16: axial dynamic delayed · axial · 3.0mm · 1.19mm/px · z∈[-44,+145]mm · 4 of 64 slices shown]
[im 1/64]
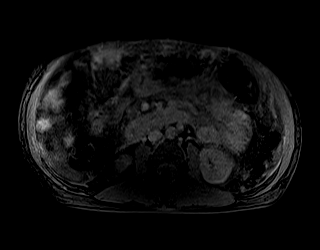
[im 22/64]
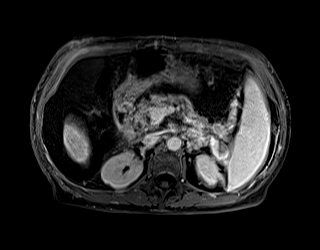
[im 43/64]
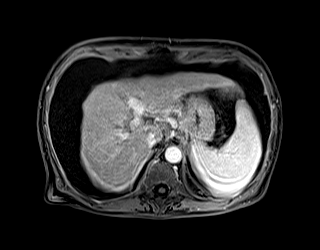
[im 64/64]
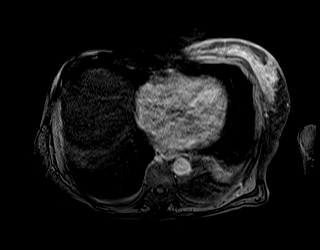

[Series 17: axial dynamic delayed_sub · axial · 3.0mm · 1.19mm/px · z∈[-44,+145]mm · 4 of 64 slices shown]
[im 1/64]
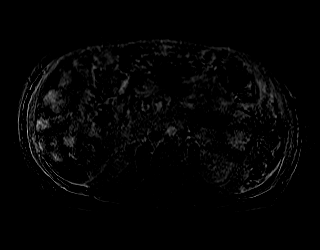
[im 22/64]
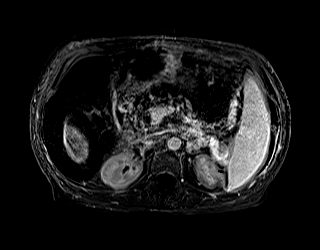
[im 43/64]
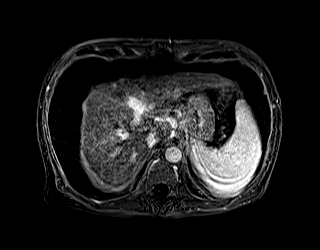
[im 64/64]
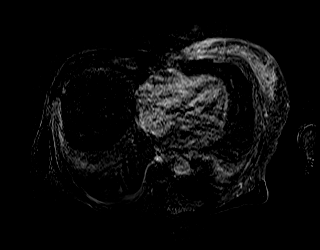

[Series 18: axial ssfse / · axial · 6.0mm · 1.19mm/px · z∈[-44,+165]mm · 2 of 30 slices shown]
[im 1/30]
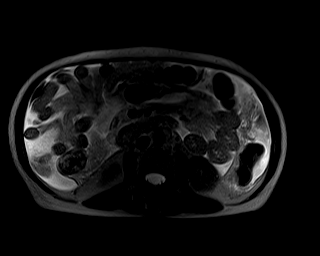
[im 30/30]
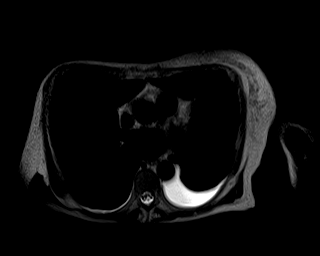

[43 of 48 positions shown; findings below may reference images not displayed]

FINDINGS: Lower chest: Small to moderate left pleural effusion with tiny right
effusion.

Hepatobiliary: Liver appearance consistent with cirrhosis. 9-10 mm
hypervascular lesion posterior right liver (segment VI on series 9:
Image 30) is again identified. This does washout to similar signal
intensity as the background liver parenchyma. No associated
high-intensity capsule. No other focal abnormality within the liver
parenchyma. Gallbladder distended but otherwise unremarkable. No
intrahepatic or extrahepatic biliary dilation.

Pancreas: No focal mass lesion. No dilatation of the main duct. No
intraparenchymal cyst. No peripancreatic edema.

Spleen:  Spleen is enlarged at 14.3 cm craniocaudal length.

Adrenals/Urinary Tract: No adrenal nodule or mass. Tiny renal cysts
evident.

Stomach/Bowel: Nondilated.

Vascular/Lymphatic: No abdominal aortic aneurysm. Small volume of
adherent nonocclusive thrombus in the main portal vein is not
excluded. Paraesophageal varices again noted. Small lymph nodes are
seen in the hepato duodenal and gastrohepatic ligaments.

Other:  Moderate to large volume ascites.

Musculoskeletal: No abnormal marrow signal within the visualized
bony anatomy.
IMPRESSION: 1. Stable appearance of the 9-10 mm hypervascular lesion in the
posterior right liver. This is compatible with Yohainer Naranjo Aguilar 3 lesion.
2. Moderate to large volume ascites.
3. Small to moderate left pleural effusion with tiny right pleural
effusion.
4. Splenomegaly and paraesophageal varices consistent with portal
venous hypertension.

## 2017-08-19 MED ORDER — GADOBENATE DIMEGLUMINE 529 MG/ML IV SOLN
10.0000 mL | Freq: Once | INTRAVENOUS | Status: AC | PRN
  Administered 2017-08-19: 10 mL via INTRAVENOUS

## 2017-08-28 ENCOUNTER — Other Ambulatory Visit: Payer: Self-pay | Admitting: Student

## 2017-08-28 DIAGNOSIS — K7460 Unspecified cirrhosis of liver: Secondary | ICD-10-CM

## 2017-08-28 DIAGNOSIS — R188 Other ascites: Principal | ICD-10-CM

## 2017-09-01 ENCOUNTER — Ambulatory Visit
Admission: RE | Admit: 2017-09-01 | Discharge: 2017-09-01 | Disposition: A | Discharge status: Home / Self Care | Payer: Medicare PPO | Source: Ambulatory Visit | Attending: Student | Admitting: Student

## 2017-09-01 ENCOUNTER — Other Ambulatory Visit
Admission: RE | Admit: 2017-09-01 | Discharge: 2017-09-01 | Disposition: A | Discharge status: Home / Self Care | Payer: Medicare PPO | Source: Ambulatory Visit | Attending: Student | Admitting: Student

## 2017-09-01 DIAGNOSIS — R188 Other ascites: Secondary | ICD-10-CM | POA: Diagnosis present

## 2017-09-01 DIAGNOSIS — K7460 Unspecified cirrhosis of liver: Secondary | ICD-10-CM | POA: Diagnosis not present

## 2017-09-01 LAB — BODY FLUID CELL COUNT WITH DIFFERENTIAL
EOS FL: 0 %
Lymphs, Fluid: 13 %
MONOCYTE-MACROPHAGE-SEROUS FLUID: 85 %
Neutrophil Count, Fluid: 2 %
OTHER CELLS FL: 0 %
Total Nucleated Cell Count, Fluid: 183 cu mm

## 2017-09-01 LAB — ALBUMIN: ALBUMIN: 3.4 g/dL — AB (ref 3.5–5.0)

## 2017-09-01 LAB — ALBUMIN, PLEURAL OR PERITONEAL FLUID: Albumin, Fluid: 1.5 g/dL

## 2017-09-01 LAB — SODIUM: SODIUM: 136 mmol/L (ref 135–145)

## 2017-09-01 IMAGING — US US PARACENTESIS
1 series · 6 of 6 positions shown · non-contrast
Comparison: none

INDICATION: Cirrhosis of liver with ascites.

[Series 1: us paracentesis · 0.23mm/px · 6 of 6 slices shown]
[im 1/6]
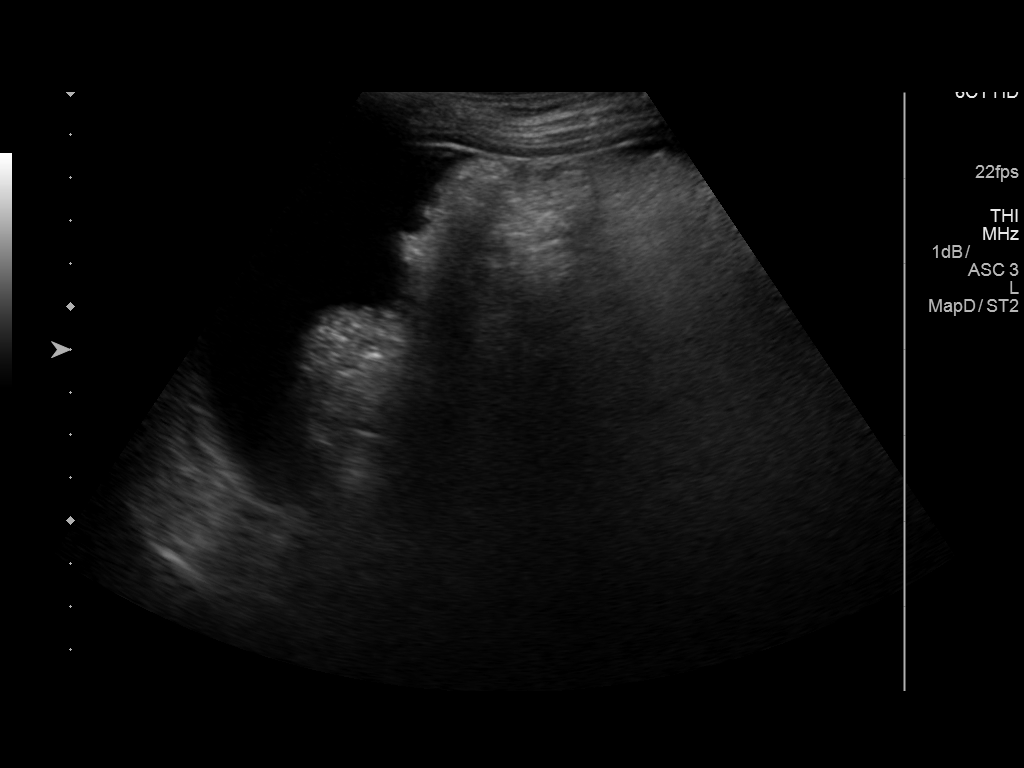
[im 2/6]
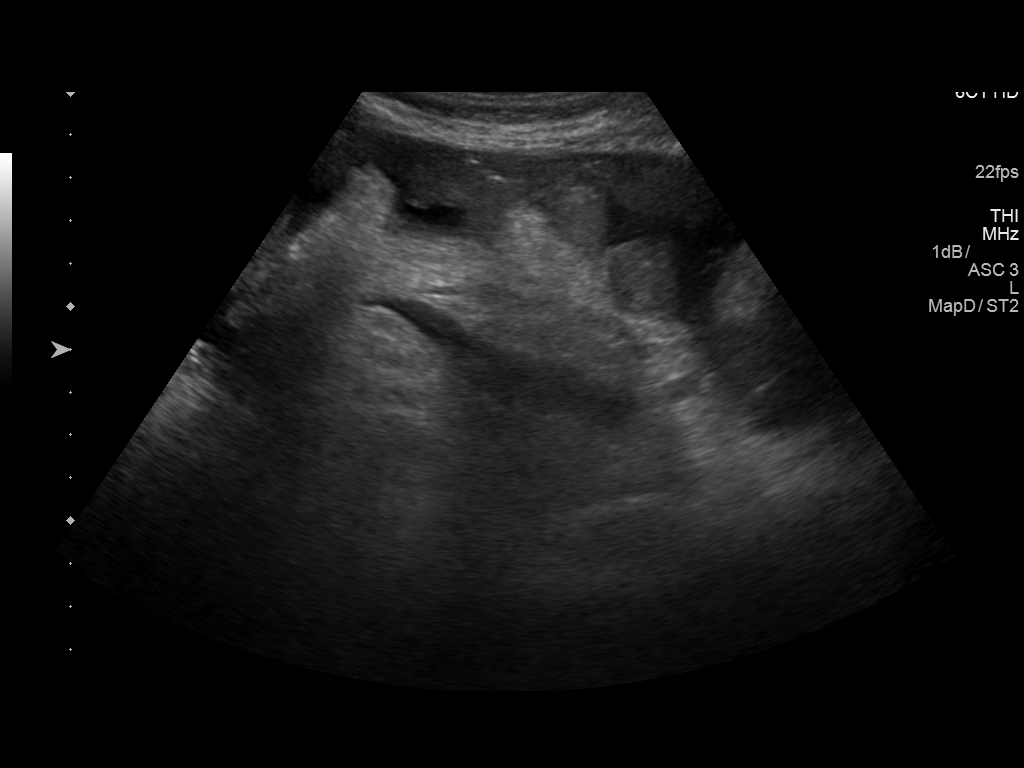
[im 3/6]
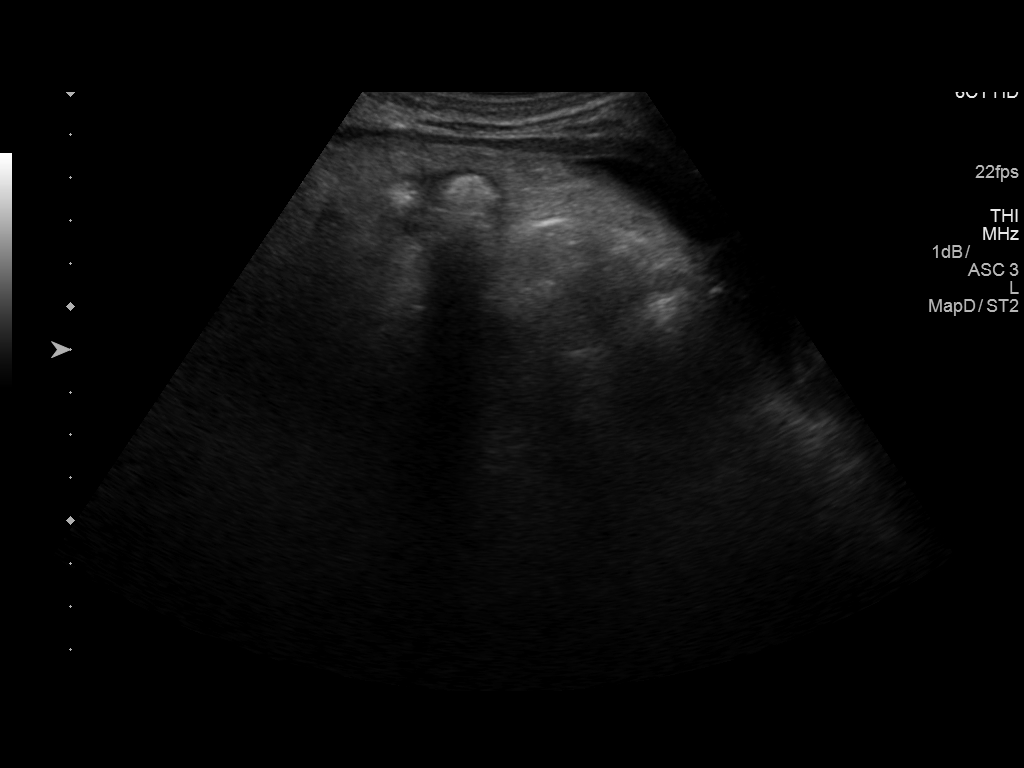
[im 4/6]
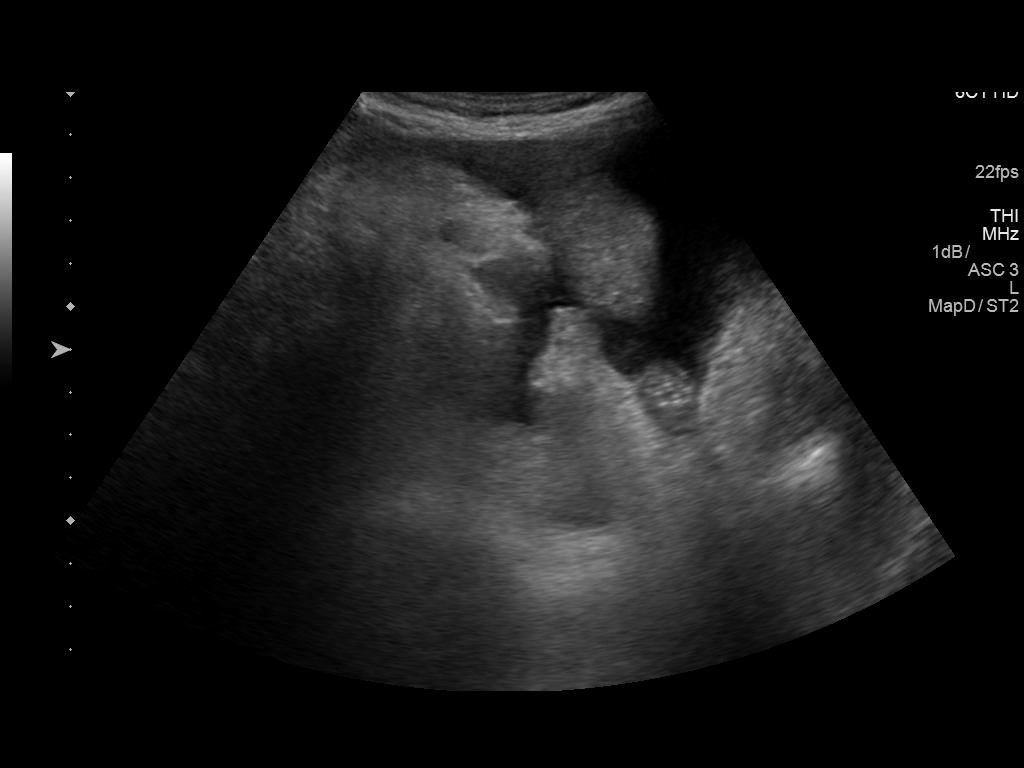
[im 5/6]
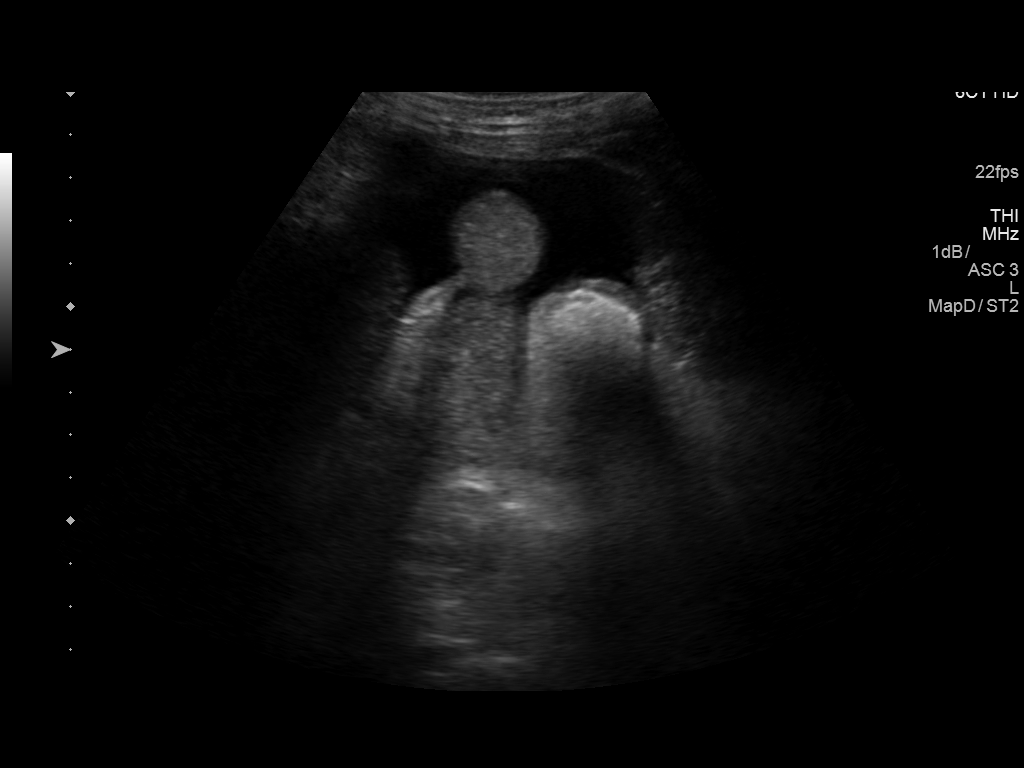
[im 6/6]
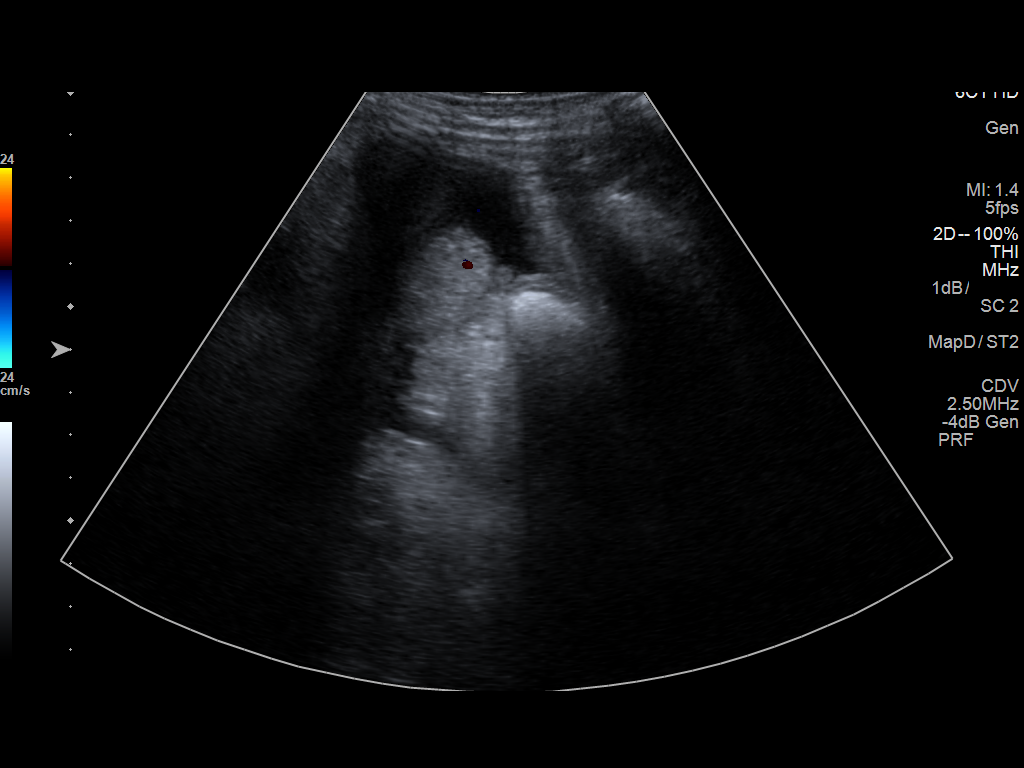

[6 of 6 positions shown; findings below may reference images not displayed]

EXAM:
ULTRASOUND GUIDED PARACENTESIS

MEDICATIONS:
None.

COMPLICATIONS:
None immediate.

PROCEDURE:
Informed written consent was obtained from the patient after a
discussion of the risks, benefits and alternatives to treatment. A
timeout was performed prior to the initiation of the procedure.

Initial ultrasound scanning demonstrates a small to moderate amount
of ascites within the left lower abdominal quadrant. The left lower
abdomen was prepped and draped in the usual sterile fashion. 1%
lidocaine was used for local anesthesia.

Following this, a 6 Fr Safe-T-Centesis catheter was introduced. An
ultrasound image was saved for documentation purposes. Fluid would
not drain from the catheter because the tube was up against bowel.
This catheter was removed. A 7 cm Yueh catheter was directed into
the peritoneal fluid with ultrasound guidance. Again, there was
difficulty with draining the fluid due to small amount of ascites
and adjacent bowel. The catheter stopped draining after 850 mL. The
catheter was removed and a dressing was applied. The patient
tolerated the procedure well without immediate post procedural
complication.
FINDINGS: A total of approximately 850 mL of yellow fluid was removed. Samples
were sent to the laboratory as requested by the clinical team.

Residual ascites remaining at the end of the procedure. Unable to
completely drain the ascites due to bowel loops up against the
catheter.
IMPRESSION: Successful ultrasound-guided paracentesis yielding 850 mL of
peritoneal fluid.

## 2017-09-01 NOTE — Procedures (Signed)
US guided paracentesis.  Small-moderate amount of ascites.  Removed 850 ml of fluid from LLQ.  Minimal blood loss and no immediate complication.

## 2017-09-01 NOTE — OR Nursing (Signed)
Pt reported she had blood work drawn at [employer] office last week, but verified in care everywhere and [facility] the requested labs were not done. Verified with Mellody Memos PA that labs ordered needed to be drawn. Labs ordered for paracentesis fluids were previously done 05/2017, Mellody Memos PA said we did not need to run them this time and discontinue that order.

## 2017-09-02 LAB — CYTOLOGY - NON PAP

## 2017-09-04 LAB — BODY FLUID CULTURE: Culture: NO GROWTH

## 2017-10-07 ENCOUNTER — Other Ambulatory Visit: Payer: Medicare PPO

## 2017-10-27 ENCOUNTER — Other Ambulatory Visit: Payer: Self-pay | Admitting: Family Medicine

## 2017-10-27 DIAGNOSIS — Z1231 Encounter for screening mammogram for malignant neoplasm of breast: Secondary | ICD-10-CM

## 2017-11-13 ENCOUNTER — Ambulatory Visit
Admission: RE | Admit: 2017-11-13 | Discharge: 2017-11-13 | Disposition: A | Discharge status: Home / Self Care | Payer: Medicare PPO | Source: Ambulatory Visit | Attending: Family Medicine | Admitting: Family Medicine

## 2017-11-13 DIAGNOSIS — Z1231 Encounter for screening mammogram for malignant neoplasm of breast: Secondary | ICD-10-CM

## 2017-11-13 IMAGING — MG MM DIGITAL SCREENING UNILAT*L* W/ TOMO W/ CAD
6 series · 6 of 18 positions shown · non-contrast
Comparison: Previous exam(s).

CLINICAL DATA: Screening.

EXAM:
DIGITAL SCREENING UNILATERAL LEFT MAMMOGRAM WITH CAD AND TOMO

[L XCCL synth-2D]
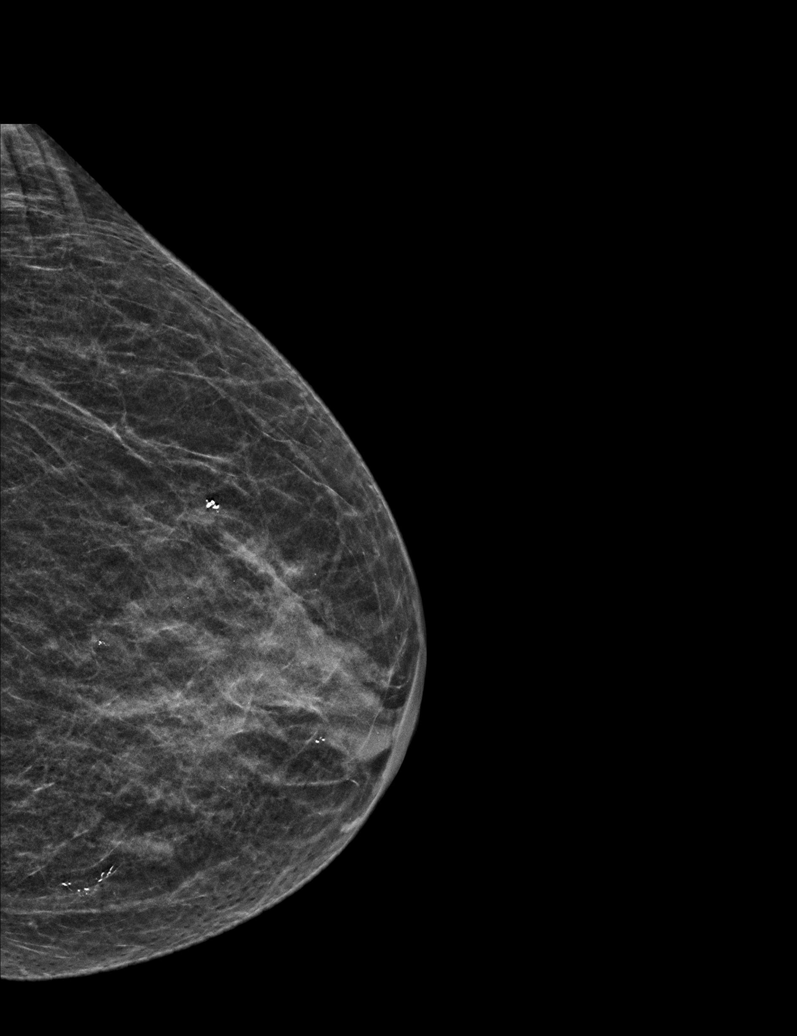

[L CC synth-2D]
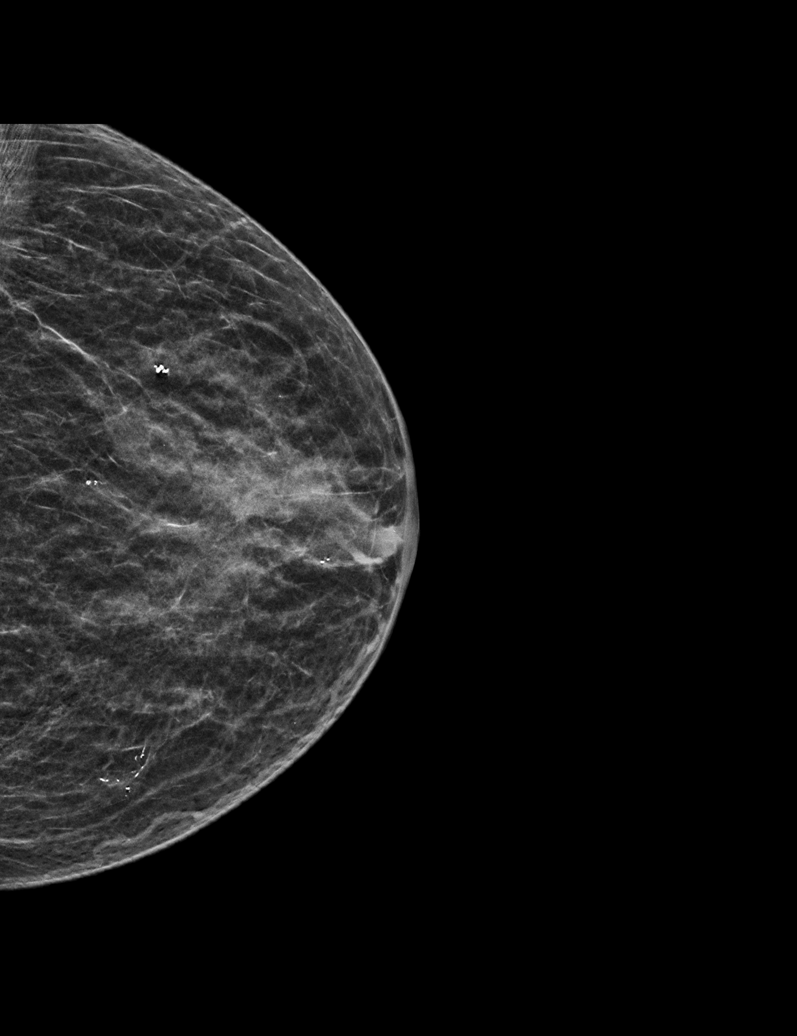

[L MLO synth-2D]
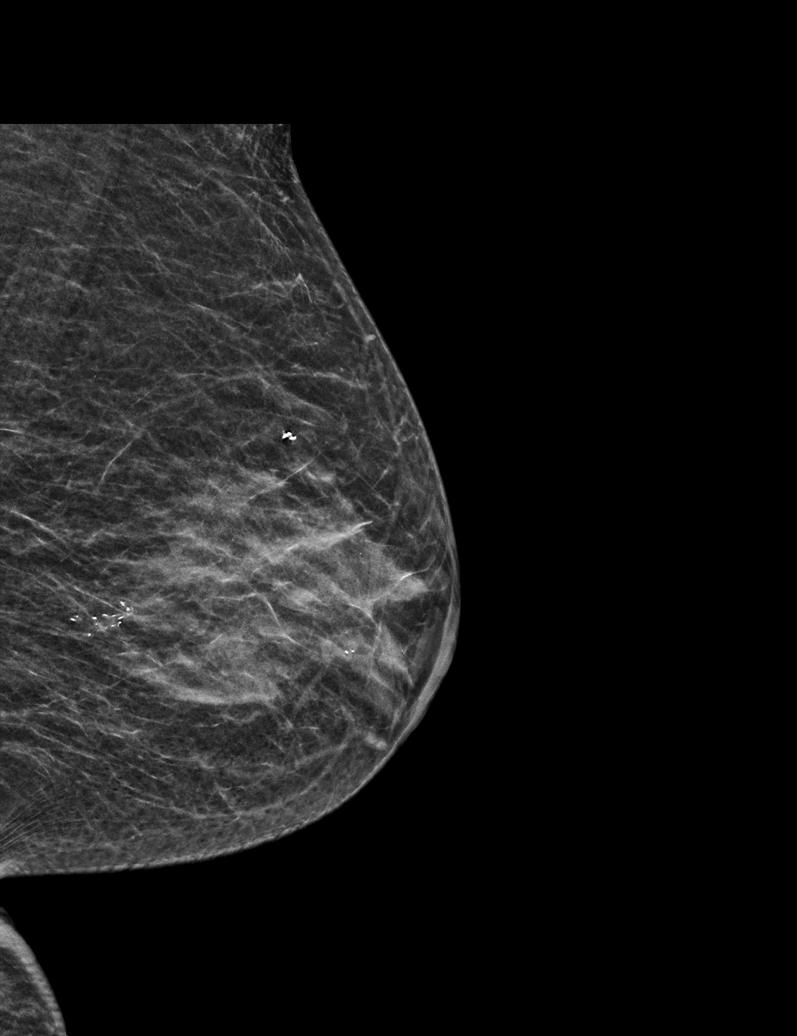

[L CC tomo · tomo slice 18/35.0]
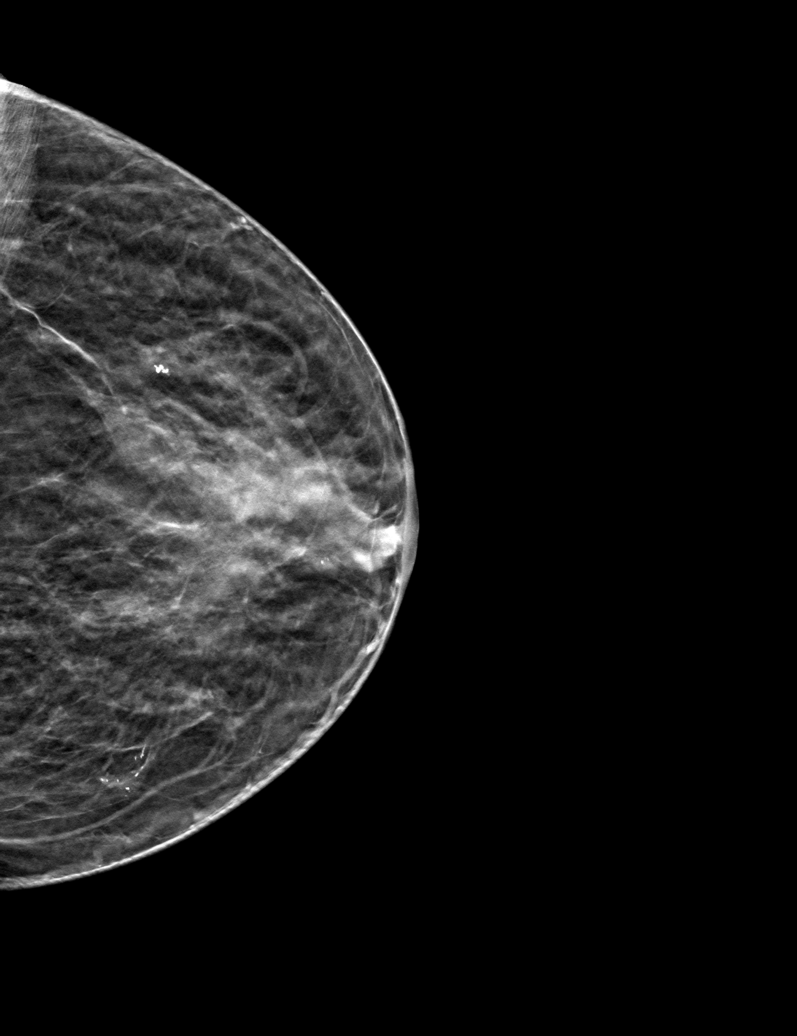

[L XCCL tomo · tomo slice 19/36.0]
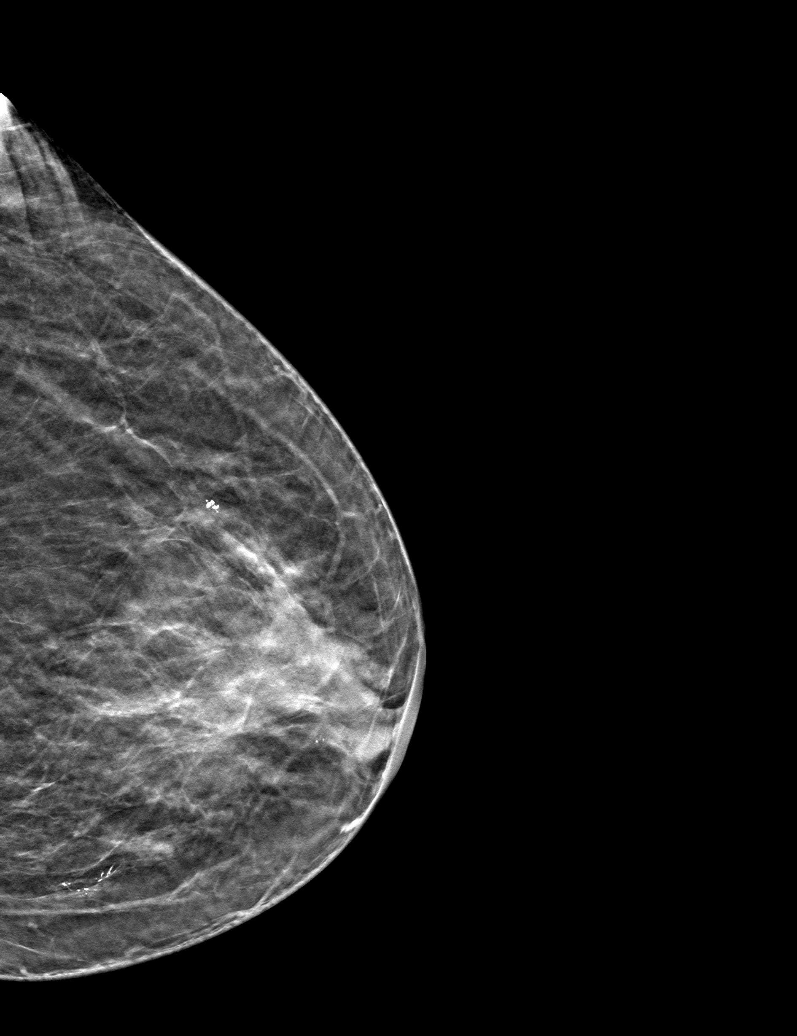

[L MLO tomo · tomo slice 19/37.0]
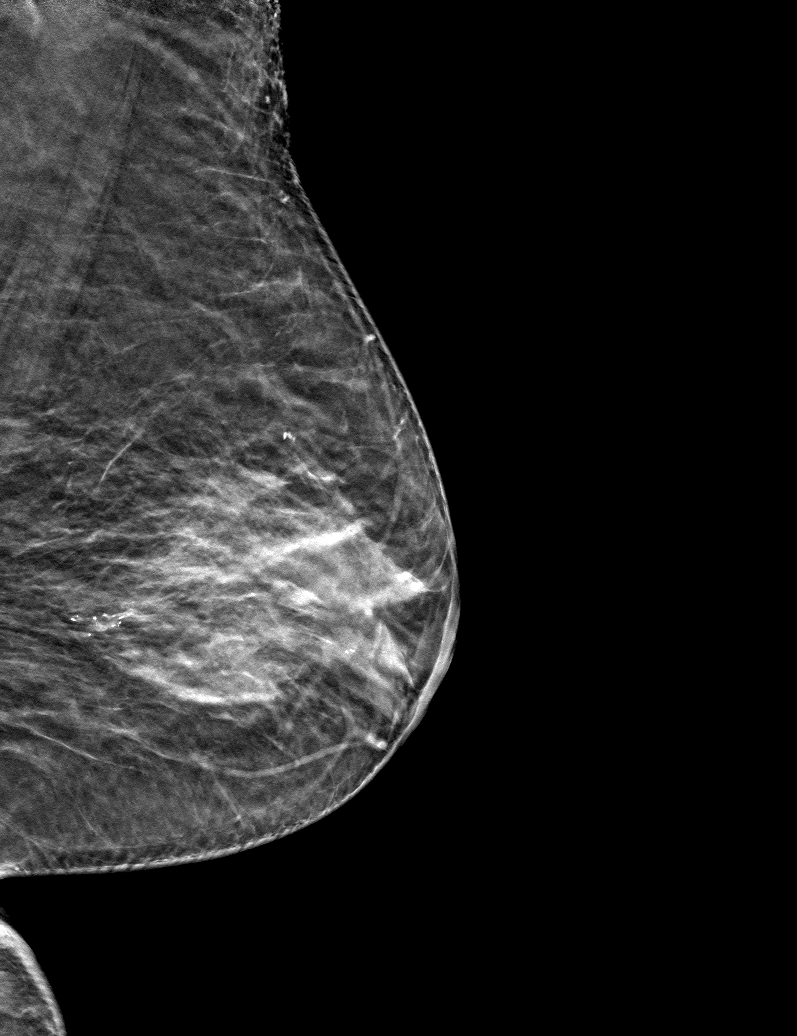

[6 of 18 positions shown; findings below may reference images not displayed]

ACR Breast Density Category c: The breast tissue is heterogeneously
dense, which may obscure small masses.
FINDINGS: There are no findings suspicious for malignancy. Images were
processed with CAD.
IMPRESSION: No mammographic evidence of malignancy. A result letter of this
screening mammogram will be mailed directly to the patient.

RECOMMENDATION:
Screening mammogram in one year. (Code:YR-2-EWS)

BI-RADS CATEGORY  1: Negative.

## 2018-01-07 ENCOUNTER — Other Ambulatory Visit: Payer: Medicare PPO

## 2018-01-07 ENCOUNTER — Ambulatory Visit: Payer: Medicare PPO | Admitting: Hematology and Oncology

## 2018-02-09 ENCOUNTER — Other Ambulatory Visit: Payer: Self-pay | Admitting: Student

## 2018-02-09 DIAGNOSIS — R188 Other ascites: Principal | ICD-10-CM

## 2018-02-09 DIAGNOSIS — R17 Unspecified jaundice: Secondary | ICD-10-CM

## 2018-02-09 DIAGNOSIS — K7460 Unspecified cirrhosis of liver: Secondary | ICD-10-CM

## 2018-02-11 ENCOUNTER — Inpatient Hospital Stay: Payer: Medicare PPO | Admitting: Hematology and Oncology

## 2018-02-11 ENCOUNTER — Other Ambulatory Visit: Payer: Self-pay | Admitting: Hematology and Oncology

## 2018-02-11 ENCOUNTER — Encounter: Payer: Self-pay | Admitting: Hematology and Oncology

## 2018-02-11 ENCOUNTER — Inpatient Hospital Stay: Payer: Medicare PPO | Attending: Hematology and Oncology

## 2018-02-11 VITALS — BP 120/79 | HR 77 | Temp 97.9°F | Resp 18 | Ht 62.0 in | Wt 103.4 lb

## 2018-02-11 DIAGNOSIS — R161 Splenomegaly, not elsewhere classified: Secondary | ICD-10-CM | POA: Insufficient documentation

## 2018-02-11 DIAGNOSIS — D649 Anemia, unspecified: Secondary | ICD-10-CM | POA: Insufficient documentation

## 2018-02-11 DIAGNOSIS — R188 Other ascites: Secondary | ICD-10-CM | POA: Insufficient documentation

## 2018-02-11 DIAGNOSIS — D696 Thrombocytopenia, unspecified: Secondary | ICD-10-CM | POA: Diagnosis present

## 2018-02-11 DIAGNOSIS — K7460 Unspecified cirrhosis of liver: Secondary | ICD-10-CM | POA: Diagnosis not present

## 2018-02-11 LAB — COMPREHENSIVE METABOLIC PANEL
ALBUMIN: 3.9 g/dL (ref 3.5–5.0)
ALT: 23 U/L (ref 0–44)
ANION GAP: 10 (ref 5–15)
AST: 39 U/L (ref 15–41)
Alkaline Phosphatase: 80 U/L (ref 38–126)
BUN: 17 mg/dL (ref 8–23)
CALCIUM: 9.7 mg/dL (ref 8.9–10.3)
CHLORIDE: 104 mmol/L (ref 98–111)
CO2: 24 mmol/L (ref 22–32)
Creatinine, Ser: 0.67 mg/dL (ref 0.44–1.00)
GFR calc non Af Amer: >60 mL/min (ref >60)
GLUCOSE: 180 mg/dL — AB (ref 70–99)
Potassium: 4.1 mmol/L (ref 3.5–5.1)
SODIUM: 138 mmol/L (ref 135–145)
Total Bilirubin: 2.2 mg/dL — ABNORMAL HIGH (ref 0.3–1.2)
Total Protein: 7.3 g/dL (ref 6.5–8.1)

## 2018-02-11 LAB — CBC WITH DIFFERENTIAL/PLATELET
ABS IMMATURE GRANULOCYTES: 0.03 10*3/uL (ref 0.00–0.07)
BASOS ABS: 0.2 10*3/uL — AB (ref 0.0–0.1)
Basophils Relative: 2 %
EOS ABS: 0.6 10*3/uL — AB (ref 0.0–0.5)
Eosinophils Relative: 8 %
HEMATOCRIT: 34.1 % — AB (ref 36.0–46.0)
HEMOGLOBIN: 11.4 g/dL — AB (ref 12.0–15.0)
IMMATURE GRANULOCYTES: 0 %
LYMPHS ABS: 1.3 10*3/uL (ref 0.7–4.0)
LYMPHS PCT: 16 %
MCH: 32.5 pg (ref 26.0–34.0)
MCHC: 33.4 g/dL (ref 30.0–36.0)
MCV: 97.2 fL (ref 80.0–100.0)
MONOS PCT: 8 %
Monocytes Absolute: 0.7 10*3/uL (ref 0.1–1.0)
NEUTROS PCT: 66 %
NRBC: 0 % (ref 0.0–0.2)
Neutro Abs: 5 10*3/uL (ref 1.7–7.7)
Platelets: 139 10*3/uL — ABNORMAL LOW (ref 150–400)
RBC: 3.51 MIL/uL — ABNORMAL LOW (ref 3.87–5.11)
RDW: 14.3 % (ref 11.5–15.5)
WBC: 7.7 10*3/uL (ref 4.0–10.5)

## 2018-02-11 LAB — FERRITIN: Ferritin: 22 ng/mL (ref 11–307)

## 2018-02-11 NOTE — Progress Notes (Signed)
No new changes noted today 

## 2018-02-11 NOTE — Progress Notes (Signed)
[facility] day:  02/11/2018   Chief Complaint: Terri Collins is a 73 y.o. 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  HPI:  The patient was last seen in the hematology clinic on 07/09/2017.  At that time, she was feeling better.  Exam was stable.  Hemoglobin was 10.5.  Platelets were 123,000.  Ferritin was 47.  Iron saturation was 27%.  Abdominal MRI on 08/19/2017 revealed a stable appearance of the 9-10 mm hypervascular lesion in the posterior right liver. This was compatible with a Li-Rads 3 lesion.  There was moderate to large volume ascites.  There was small to moderate left pleural effusion with tiny right pleural effusion.  There was splenomegaly (14.3 cm) and paraesophageal varices consistent with portal venous hypertension.   She underwent ultrasound guided paracentesis on 09/01/2017. 850 ml of fluid was removed.  Residual ascites remained secondary to bowel loops up against the catheter.  She was seen by Cherylynn Ridges at the [facility] on 11/03/2017.  Notes reviewed.  She was scheduled for a [facility] hepatology consult.  Repeat liver MRI due 01/2018.  She is scheduled for follow-up in 01/2018.  Patient has intermittent been on Nadolol for her known paraesophageal varices. She stopped taking this medication because she reviewed the side effects and "got scared". Patient notes that she has has spoken with her GI and was advised to restart the medication as prescribed.  She is scheduled to follow up with [facility] transplant center on 07/27/2018.  During the interim, patient is feeling generally well. She denies any acute concerns. Patient has not appreciated any increased bruising or bleeding. Patient denies that she has experienced any B symptoms. She denies any interval infections. Patient denies any abdominal pain.   Patient advises that she maintains an adequate appetite.  She is eating well. Weight today is 103 lb 6.3 oz (46.9 kg), which compared to her last visit to the clinic, represents a 9 pound decrease.   Patient denies pain in the clinic today.   Past Medical History:  Diagnosis Date  . Anemia   . Arthritis   . Cancer [facility]) 1999   right breast  . Depression   . Diabetes mellitus without complication (Organ)   . Hyperlipidemia   . Hypertension   . Osteoporosis   . Vertigo   . Vitamin D deficiency     Past Surgical History:  Procedure Laterality Date  . BREAST BIOPSY Left 2006   benign. stereo no clip  . BREAST SURGERY    . CATARACT EXTRACTION W/PHACO Left 08/20/2016   Procedure: CATARACT EXTRACTION PHACO AND INTRAOCULAR LENS PLACEMENT (IOC);  Surgeon: Birder Robson, MD;  Location: [facility] ORS;  Service: Ophthalmology;  Laterality: Left;  [place] 01:11.3 AP% 20.4 CDE 14.58 FLUID PACK LOT # [id number]  . CATARACT EXTRACTION W/PHACO Right 10/08/2016   Procedure: CATARACT EXTRACTION PHACO AND INTRAOCULAR LENS PLACEMENT (IOC);  Surgeon: Birder Robson, MD;  Location: [facility] ORS;  Service: Ophthalmology;  Laterality: Right;  [place]  01:10 AP% 19.5 CDE 13.70 fluid pack lot # [id number]  . COLONOSCOPY    . COLONOSCOPY WITH PROPOFOL N/A 08/05/2014   Procedure: COLONOSCOPY WITH PROPOFOL;  Surgeon: Manya Silvas, MD;  Location: [facility] ENDOSCOPY;  Service: Endoscopy;  Laterality: N/A;  . ESOPHAGOGASTRODUODENOSCOPY    . ESOPHAGOGASTRODUODENOSCOPY N/A 03/31/2017   Procedure: ESOPHAGOGASTRODUODENOSCOPY (EGD);  Surgeon: Manya Silvas, MD;  Location: [facility] ENDOSCOPY;  Service: Endoscopy;  Laterality: N/A;  . MASTECTOMY Right 1999    Family History  Problem Relation Age of Onset  . Breast cancer Sister   . Breast cancer Maternal Aunt     Social History:  reports that she has never smoked. She has never used smokeless tobacco. She reports that she does not drink alcohol. No history on file for drug.   She raised tobacco, but never smoked.  She drinks sangria on  special occasions.  She worked at [employer] for 20 years then [employer].  Her husband died 3 years ago.  She has 2 daughters.  She lives in [place].  The patient is alone today.  Allergies: No Known Allergies  Current Medications: Current Outpatient Medications  Medication Sig Dispense Refill  . aspirin EC 81 MG tablet Take 81 mg by mouth daily.    Marland Kitchen atorvastatin (LIPITOR) 20 MG tablet Take 20 mg by mouth daily.    . Cholecalciferol (VITAMIN D3) 2000 UNITS capsule Take 2,000 Units by mouth daily.    . enalapril (VASOTEC) 10 MG tablet Take 20 mg by mouth daily.     . furosemide (LASIX) 20 MG tablet Take 1 tablet by mouth daily.    Marland Kitchen glipiZIDE (GLUCOTROL) 5 MG tablet Take 5 mg by mouth daily before breakfast.    . glucose blood test strip 1 each by Other route as needed for other. Use as instructed    . metFORMIN (GLUCOPHAGE) 500 MG tablet Take 1,000 mg by mouth 2 (two) times daily with a meal.    . spironolactone (ALDACTONE) 50 MG tablet Take 1 tablet by mouth every morning.    . sucralfate (CARAFATE) 1 g tablet Take 1 tablet by mouth 2 (two) times daily before a meal.  3   No current facility-administered medications for this visit.     Review of Systems:  GENERAL:  Feels "ok".  Stays "cold a lot". No fevers, sweats.  Weight down 9 pounds. PERFORMANCE STATUS (ECOG):  1 HEENT:  No visual changes, runny nose, sore throat, mouth sores or tenderness. Lungs: No shortness of breath or cough.  No hemoptysis. Cardiac:  No chest pain, palpitations, orthopnea, or PND. GI:  Interval paracentesis.  No nausea, vomiting, diarrhea, constipation, melena or hematochezia. GU:  No urgency, frequency, dysuria, or hematuria. Musculoskeletal:  No back pain.  No joint pain.  No muscle tenderness. Extremities:  No pain or swelling. Skin:  No rashes or skin changes. Neuro:  No headache, numbness or weakness, balance or coordination issues. Endocrine:  Diabetes.  No thyroid issues, hot flashes or night  sweats. Psych:  No mood changes, depression or anxiety. Pain:  No focal pain. Review of systems:  All other systems reviewed and found to be negative.   Physical Exam: Blood pressure 120/79, pulse 77, temperature 97.9 F (36.6 C), temperature source Tympanic, resp. rate 18, height 5\' 2"  (1.575 m), weight 103 lb 6.3 oz (46.9 kg), SpO2 100 %. GENERAL:  Thin woman sitting comfortably in the exam room in no acute distress. MENTAL STATUS:  Alert and oriented to person, place and time. HEAD:  Blonde hair.  Normocephalic, atraumatic, face symmetric, no Cushingoid features. EYES:  Blue eyes.  Right eye twitch.  Pupils equal round and reactive to light and accomodation.  No conjunctivitis or scleral icterus. ENT:  Oropharynx clear without lesion.  Tongue normal. Mucous membranes moist.  RESPIRATORY:  Clear to auscultation without rales, wheezes or rhonchi. CARDIOVASCULAR:  Regular rate and rhythm without murmur, rub or  gallop. ABDOMEN:  Soft, non-tender, with active bowel sounds, and no hepatosplenomegaly.  No masses. SKIN:  No rashes, ulcers or lesions. EXTREMITIES: No edema, no skin discoloration or tenderness.  No palpable cords. LYMPH NODES: No palpable cervical, supraclavicular, axillary or inguinal adenopathy  NEUROLOGICAL: Unremarkable. PSYCH:  Appropriate.    Appointment on 02/11/2018  Component Date Value Ref Range Status  . Sodium 02/11/2018 138  135 - 145 mmol/L Final  . Potassium 02/11/2018 4.1  3.5 - 5.1 mmol/L Final  . Chloride 02/11/2018 104  98 - 111 mmol/L Final  . CO2 02/11/2018 24  22 - 32 mmol/L Final  . Glucose, Bld 02/11/2018 180* 70 - 99 mg/dL Final  . BUN 02/11/2018 17  8 - 23 mg/dL Final  . Creatinine, Ser 02/11/2018 0.67  0.44 - 1.00 mg/dL Final  . Calcium 02/11/2018 9.7  8.9 - 10.3 mg/dL Final  . Total Protein 02/11/2018 7.3  6.5 - 8.1 g/dL Final  . Albumin 02/11/2018 3.9  3.5 - 5.0 g/dL Final  . AST 02/11/2018 39  15 - 41 U/L Final  . ALT 02/11/2018 23  0 - 44  U/L Final  . Alkaline Phosphatase 02/11/2018 80  38 - 126 U/L Final  . Total Bilirubin 02/11/2018 2.2* 0.3 - 1.2 mg/dL Final  . GFR calc non Af Amer 02/11/2018 >60  >60 mL/min Final  . GFR calc Af Amer 02/11/2018 >60  >60 mL/min Final  . Anion gap 02/11/2018 10  5 - 15 Final   Performed at [facility], [address] [place], [place] [address]  . WBC 02/11/2018 7.7  4.0 - 10.5 K/uL Final  . RBC 02/11/2018 3.51* 3.87 - 5.11 MIL/uL Final  . Hemoglobin 02/11/2018 11.4* 12.0 - 15.0 g/dL Final  . HCT 02/11/2018 34.1* 36.0 - 46.0 % Final  . MCV 02/11/2018 97.2  80.0 - 100.0 fL Final  . MCH 02/11/2018 32.5  26.0 - 34.0 pg Final  . MCHC 02/11/2018 33.4  30.0 - 36.0 g/dL Final  . RDW 02/11/2018 14.3  11.5 - 15.5 % Final  . Platelets 02/11/2018 139* 150 - 400 K/uL Final  . nRBC 02/11/2018 0.0  0.0 - 0.2 % Final  . Neutrophils Relative % 02/11/2018 66  % Final  . Neutro Abs 02/11/2018 5.0  1.7 - 7.7 K/uL Final  . Lymphocytes Relative 02/11/2018 16  % Final  . Lymphs Abs 02/11/2018 1.3  0.7 - 4.0 K/uL Final  . Monocytes Relative 02/11/2018 8  % Final  . Monocytes Absolute 02/11/2018 0.7  0.1 - 1.0 K/uL Final  . Eosinophils Relative 02/11/2018 8  % Final  . Eosinophils Absolute 02/11/2018 0.6* 0.0 - 0.5 K/uL Final  . Basophils Relative 02/11/2018 2  % Final  . Basophils Absolute 02/11/2018 0.2* 0.0 - 0.1 K/uL Final  . Immature Granulocytes 02/11/2018 0  % Final  . Abs Immature Granulocytes 02/11/2018 0.03  0.00 - 0.07 K/uL Final   Performed at [facility], [address] [place], [place] [address]    Assessment:  ZAMYAH WIESMAN is a 73 y.o. 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  Platelet count has ranged between 76,000 - 122,000 without trend.  Platelets count was 76,000 on 12/05/2016 and 96,000 on 12/16/2016.  WBC and differential are unremarkable.  She denies any new medications or herbal products.  Work-up on 01/01/2017  revealed a hematocrit of 34, hemoglobin 11.6, MCV 97.1, platelets 91,000, white count of 5200 with  an [place] of 3300. Differential was normal.  Platelet count in a citrate tube was 83,000.  Normal studies included:  B12, folate, ANA, SPEP, free light chain ration, HIV testing, hepatitis B core antibody total.  Retic was 1.4%.  PT was 14.7 (INR 1.16) and PTT 39 (24-36).  TSH was 0.166 (0.35-4.5).   She has a mild normocytic anemia.  Hematocrit was 33.5, hemoglobin 11.4, and MCV 96 on 12/16/2016.  Iron saturation was 19% with a TIBC of 411 on 12/16/2016.  She has had a mildly elevated bilirubin since 03/28/2015.  Bilirubin was 2.1 (direct 0.6) on 12/06/2016.  Normal studies inlcuded: hepatitis A IgM, hepatitis B core IgM antibody, hepatitis B surface antigen, hepatitis C antibody, copper level, and anti-mitochondrial antibody screen.  Abdominal ultrasound on 12/02/2011 revealed a spleen upper limits of normal (13.73 cm).  Abdominal ultrasound on 12/23/2016 revealed a heterogeneous echotexture with lobulated contour of the liver suggestive of cirrhosis.  There was gallbladder wall thickening and free fluid adjacent to the gallbladder which may be related to cirrhosis.  There was a 3 mm gallbladder polyp.    Liver MRI on 01/02/2017 revealed morphologic features of the liver compatible with cirrhosis.  There was stigmata of portal venous hypertension including splenomegaly, esophageal varices, and ascites.  Spleen measured 12.5 x 5.9 x 13 cm (volume 500 cm3).  There was an arterial phase enhancing structure in the posterior right lobe of liver considered an LI-3 lesion, indeterminate probability for [facility]. Recommendation was for followup imaging in 6 months to assess for any threshold growth or change in imaging characteristics of this structure.  Ultrasound guided paracentesis on 06/06/2017 revealed 5L of clear yellow fluid.  Cytology revealed inflammation and reactive mesothelial cells.  AFP was 5.5 on 02/05/2017,  3.4 on 07/09/2017, and 3.9 on 02/11/2018.  She has a history of stage II right breast cancer in 1999 (no records available).  She underwent surgery alone followed by 5 years of tamoxifen.  Her sister had breast cancer in her early 33s.  Symptomatically, she denies any new complaints.  Exam is stable.  Hemoglobin is 11.4.  Platelet count is 139,000.  Ferritin is 22.  Plan: 1.   Labs today:  CBC with diff, CMP, AFP, ferritin. 2.   Thrombocytopenia  Platelet count is 139,000.  Etiology secondary to underlying liver disease and mild splenomegaly. 3.    Normocytic anemia  Hemoglobin 11.4.  MCV 97.2.  B12 and folate normal 12/2016.  Check iron stores. 4.   Cirrhosis with portal hypertension  Interval paracentesis.  Patient followed closely by Dr. Vira Agar and Denice Paradise, NP at [facility]. 5.  RTC in 3 months for labs (CBC with diff). 6.  RTC in 6 months for MD assessment and labs (CBC with diff, CMP, AFP, ferritin, B12, folate).  Addendum:  Patient contacted on 02/13/2018 regarding her low iron stores.  Ferrous sulfate with OJ or vitamin C recommended.     Honor Loh, NP  02/11/2018, 10:55 AM  I saw and evaluated the patient, participating in the key portions of the service and reviewing pertinent diagnostic studies and records.  I reviewed the nurse practitioner's note and agree with the findings and the plan.  The assessment and plan were discussed with the patient.  Several questions were asked by the patient and answered.   Nolon Stalls, MD 02/11/2018,10:55 AM

## 2018-02-12 LAB — AFP TUMOR MARKER: AFP, Serum, Tumor Marker: 3.9 ng/mL (ref 0.0–8.3)

## 2018-02-13 ENCOUNTER — Telehealth: Payer: Self-pay

## 2018-02-13 NOTE — Telephone Encounter (Signed)
-----   Message from Karen Kitchens, NP sent at 02/12/2018 10:33 AM EST ----- Regarding: FW: Ferritin low  ----- Message ----- From: Lequita Asal, MD Sent: 02/12/2018   9:31 AM EST To: Karen Kitchens, NP Subject: Ferritin low                                    See if she is willing to try oral iron once a day with OJ or vitamin C.  M ----- Message ----- From: Interface, Lab In [place] Sent: 02/11/2018   4:03 PM EST To: Lequita Asal, MD

## 2018-02-13 NOTE — Telephone Encounter (Signed)
Patient called stating she was returning a call from yesterday and she was advised to take Fe SO4 325 mg over the counter with OJ  or that we could call in Vitron C and she states she will take the iron with OJ

## 2018-02-13 NOTE — Telephone Encounter (Signed)
Attempted to contact patient on 1/16/202 and left VM. Attempted again today and left VM. Contacted daughter, Amy, and advised of result and adding ferrous sulfate 325 MG daily along with OJ or if she would rather, advised we could call in Vitron-C. Informed daughter to call back if she would like the RX instead of buying OTC. Daughter states understanding and reports she will inform her mother. Denies any questions at this time.

## 2018-03-03 ENCOUNTER — Ambulatory Visit
Admission: RE | Admit: 2018-03-03 | Discharge: 2018-03-03 | Disposition: A | Discharge status: Home / Self Care | Payer: Medicare PPO | Source: Ambulatory Visit | Attending: Student | Admitting: Student

## 2018-03-03 DIAGNOSIS — R17 Unspecified jaundice: Secondary | ICD-10-CM | POA: Insufficient documentation

## 2018-03-03 DIAGNOSIS — K7460 Unspecified cirrhosis of liver: Secondary | ICD-10-CM | POA: Insufficient documentation

## 2018-03-03 DIAGNOSIS — R188 Other ascites: Secondary | ICD-10-CM | POA: Diagnosis present

## 2018-03-03 IMAGING — MR MR ABDOMEN WO/W CM
16 of 17 series · 45 of 48 positions shown · IV contrast (gadavist)
Comparison: Multiple exams, including 08/19/2017

CLINICAL DATA: Cirrhosis, follow up of MELICIEN category 3 lesion.

EXAM:
MRI ABDOMEN WITHOUT AND WITH CONTRAST
TECHNIQUE: Multiplanar multisequence MR imaging of the abdomen was performed
both before and after the administration of intravenous contrast.
CONTRAST:  4 cc Gadavist

[Series 2: T2 · coronal · 6.0mm · 1.19mm/px · 2 of 25 slices shown (1 of 2)]
[im 1/25]
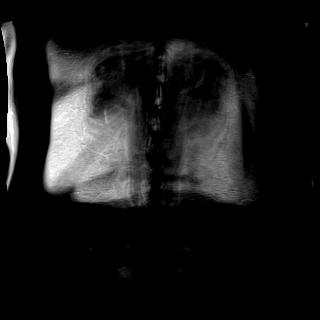
[im 25/25]
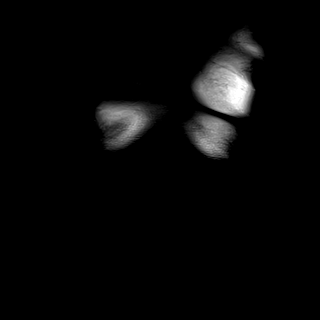

[Series 5: T2 fat-sat · axial · 6.0mm · 1.06mm/px · z∈[-60,+178]mm · 2 of 34 slices shown]
[im 1/34]
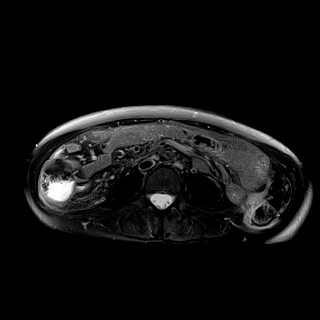
[im 34/34]
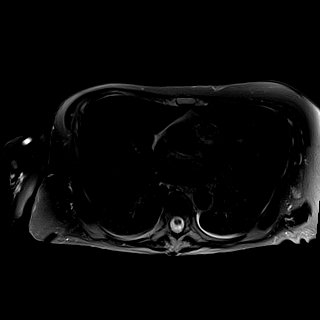

[Series 7: T2 · axial · 6.0mm · 1.06mm/px · z∈[-28,+181]mm · 2 of 30 slices shown (2 of 2)]
[im 1/30]
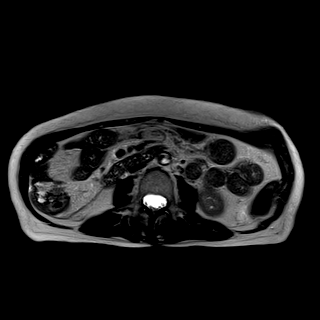
[im 30/30]
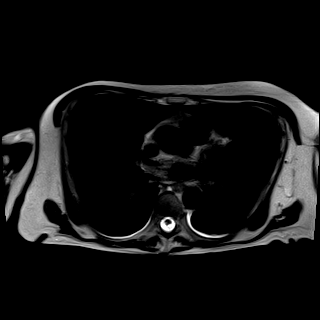

[Series 8: ax dwi_tracew · axial · 6.0mm · 1.27mm/px · z∈[-60,+178]mm · 5 of 102 slices shown]
[im 1/102]
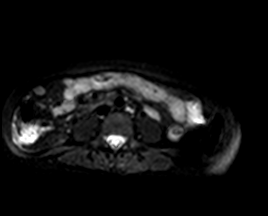
[im 26/102]
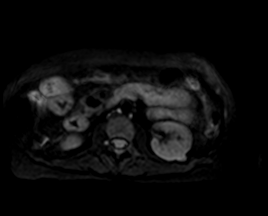
[im 51/102]
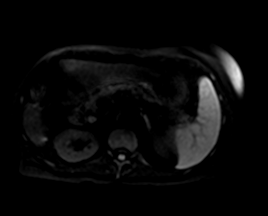
[im 76/102]
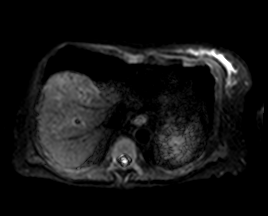
[im 102/102]
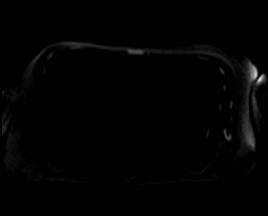

[Series 9: ax dwi_adc · axial · 6.0mm · 1.27mm/px · z∈[-60,+178]mm · 2 of 34 slices shown]
[im 1/34]
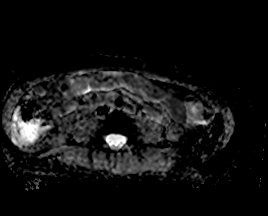
[im 34/34]
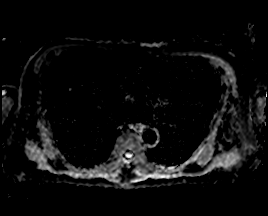

[Series 10: T1 · axial · 6.0mm · 0.66mm/px · z∈[-47,+176]mm · 4 of 64 slices shown]
[im 1/64]
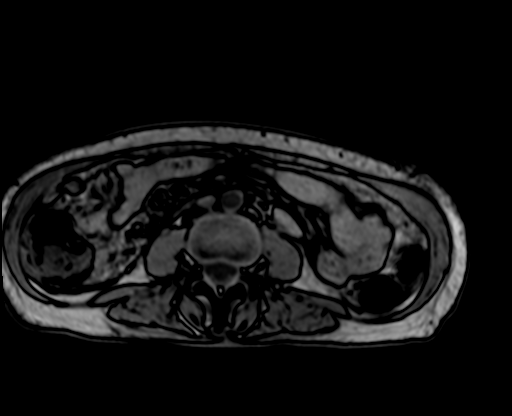
[im 22/64]
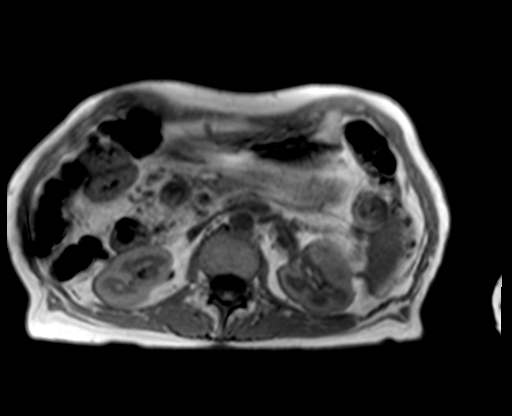
[im 43/64]
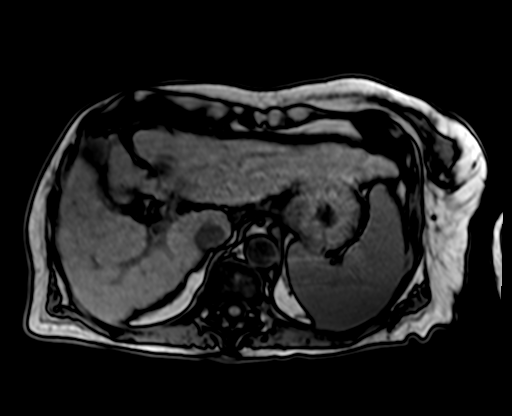
[im 64/64]
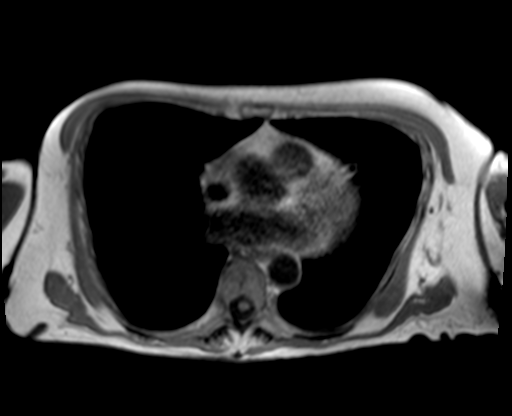

[Series 11: bSSFP · axial · 6.0mm · 0.66mm/px · 1 of 32 slices shown]
[im 1/32]
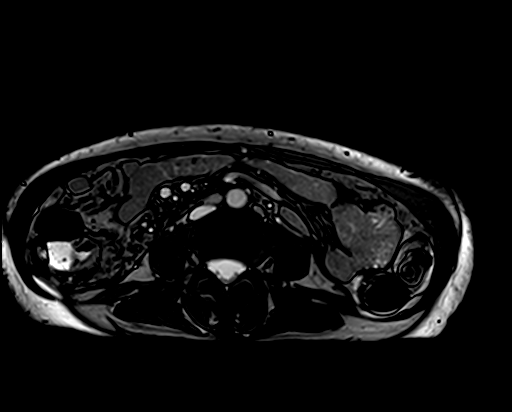

[Series 12: T1 dynamic fat-sat · axial · non-contrast · 3.0mm · 1.06mm/px · z∈[-62,+175]mm · 3 of 80 slices shown (1 of 4)]
[im 1/80]
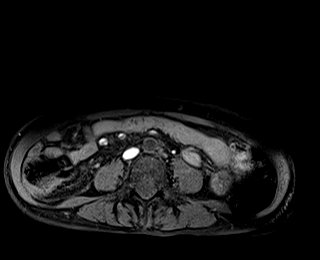
[im 40/80]
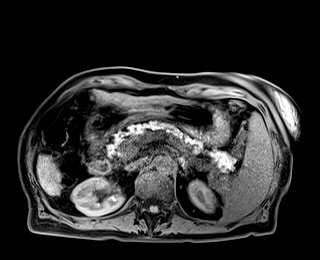
[im 80/80]
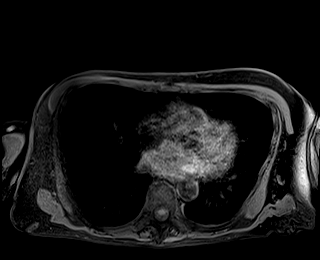

[Series 13: T1 dynamic fat-sat post-contrast · axial · 3.0mm · 1.06mm/px · z∈[-62,+175]mm · 3 of 80 slices shown (1 of 4)]
[im 1/80]
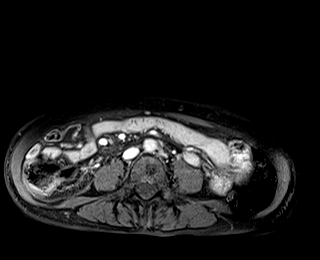
[im 40/80]
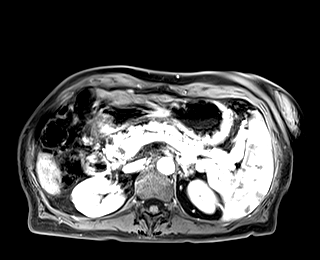
[im 80/80]
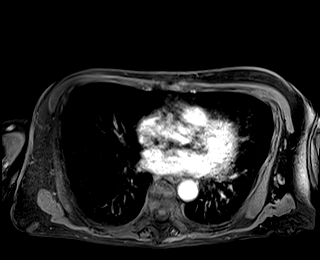

[Series 14: T1 dynamic fat-sat · axial · 3.0mm · 1.06mm/px · z∈[-62,+175]mm · 3 of 80 slices shown (2 of 4)]
[im 1/80]
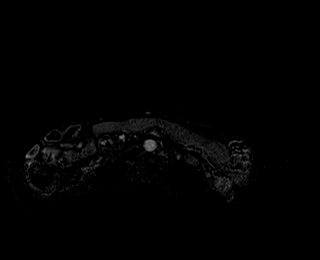
[im 40/80]
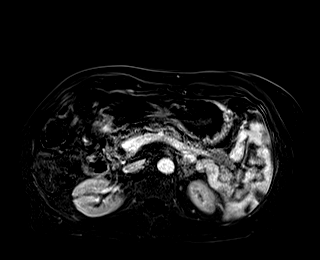
[im 80/80]
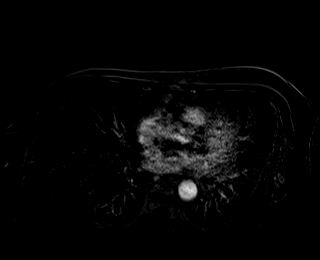

[Series 15: T1 dynamic fat-sat post-contrast · axial · 3.0mm · 1.06mm/px · z∈[-62,+175]mm · 3 of 80 slices shown (2 of 4)]
[im 1/80]
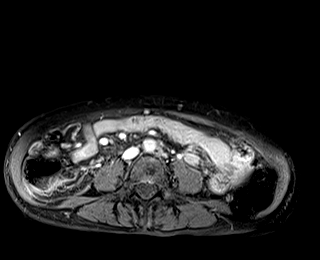
[im 40/80]
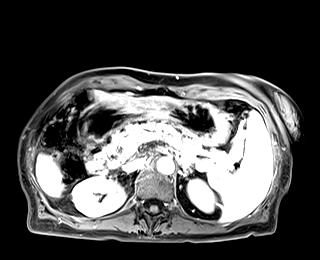
[im 80/80]
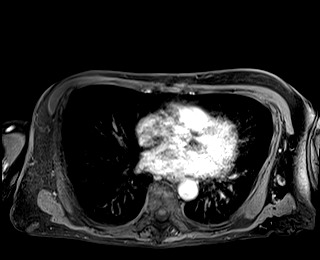

[Series 16: T1 dynamic fat-sat · axial · 3.0mm · 1.06mm/px · z∈[-62,+175]mm · 3 of 80 slices shown (3 of 4)]
[im 1/80]
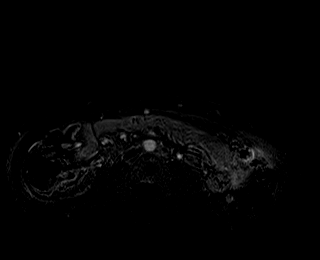
[im 40/80]
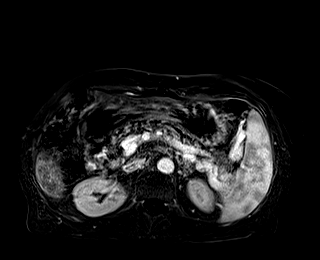
[im 80/80]
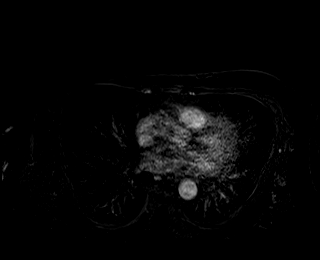

[Series 17: T1 dynamic fat-sat post-contrast · axial · 3.0mm · 1.06mm/px · z∈[-62,+175]mm · 3 of 80 slices shown (3 of 4)]
[im 1/80]
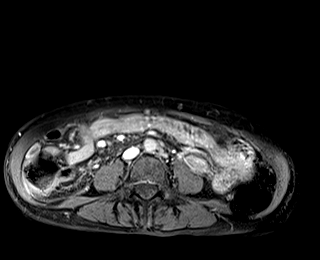
[im 40/80]
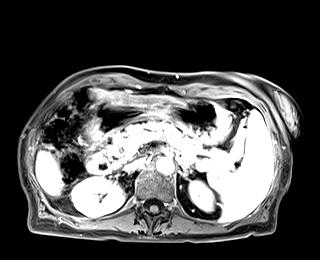
[im 80/80]
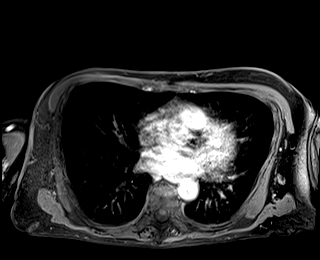

[Series 18: T1 dynamic fat-sat · axial · 3.0mm · 1.06mm/px · z∈[-62,+175]mm · 3 of 80 slices shown (4 of 4)]
[im 1/80]
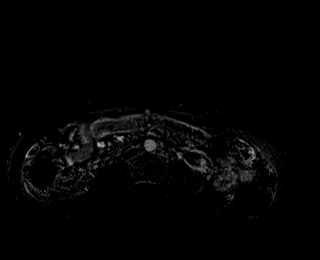
[im 40/80]
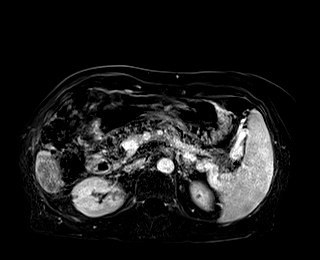
[im 80/80]
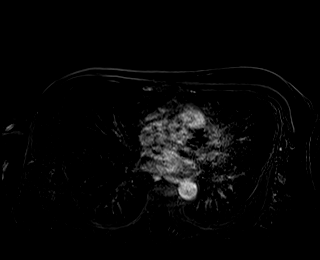

[Series 19: T1 dynamic post-contrast · coronal · 3.0mm · 1.31mm/px · 3 of 72 slices shown]
[im 1/72]
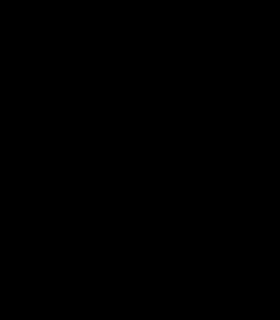
[im 36/72]
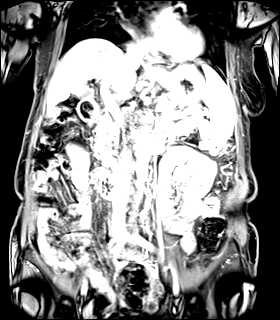
[im 72/72]
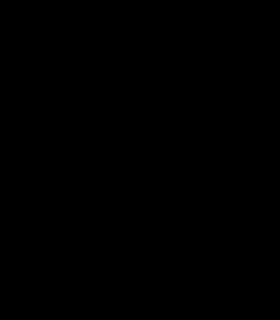

[Series 20: T1 dynamic fat-sat post-contrast · axial · 3.0mm · 1.06mm/px · z∈[-62,+175]mm · 3 of 80 slices shown (4 of 4)]
[im 1/80]
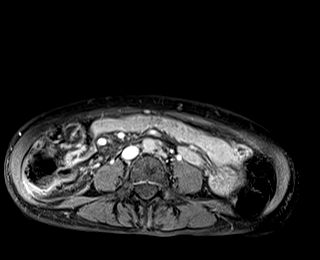
[im 40/80]
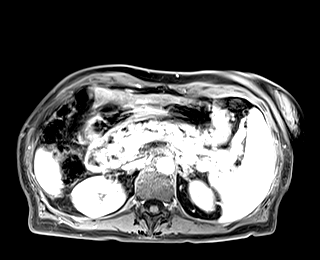
[im 80/80]
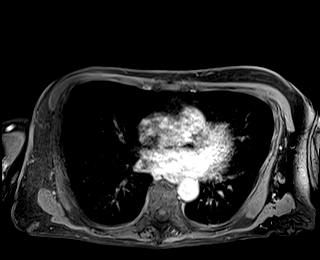

[45 of 48 positions shown; findings below may reference images not displayed]

FINDINGS: Lower chest: Unremarkable

Hepatobiliary: Hepatic cirrhosis with reticular accentuated T2
signal favoring fibrosis.

Posteriorly in the right hepatic lobe on image 27/13 there is
arterial phase enhancing lesion measuring 1.0 by 1.0 cm (previously
measuring 1.0 by 0.9 cm) which demonstrates washout on later phases
such as image 27/15. I do not see rim enhancement to favor a capsule
appearance. At the current size of 10 mm and with non peripheral
washout and non rim arterial phase enhancement this is considered
MELICIEN category LR-5 ("definitely HCC").

There is some new enhancement adjacent to the gallbladder fossa and
segment 5 of the liver measuring about 1.4 by 0.9 cm on image [DATE].
The appearance on image 27/20 is suspicious for washout of this
lesion, making this lesion also MELICIEN category LR-5.

Pancreas: 1.0 by 0.7 cm cystic lesion along the pancreatic head, no
definite connectivity to the dorsal pancreatic duct, not well seen
on prior exams. No associated enhancement.

Spleen: The spleen measures 12.9 by 4.3 by 12.7 cm (volume = 370
cm^3).

Adrenals/Urinary Tract:  Unremarkable

Stomach/Bowel: Unremarkable

Vascular/Lymphatic: Uphill varices adjacent to the distal esophagus.
Gastric varices. Splenic varices with splenorenal shunting.

Other: Trace ascites along the right paracolic gutter and
perihepatic region.

Musculoskeletal: Lower lumbar degenerative disc disease.
IMPRESSION: 1. The original lesion in the right hepatic lobe measures 1.0 cm in
diameter and demonstrates diffuse arterial phase enhancement and
subsequent washout. This qualifies as MELICIEN category LR-5
(definitely HCC).
2. A second lesion in segment 5 liver adjacent to the gallbladder
fossa measuring 1.4 by 0.9 cm likewise demonstrates arterial phase
enhancement and suspected washout, most compatible with MELICIEN
category LR-5.
3. Accordingly the appearance is compatible with multifocal
hepatocellular carcinoma.
4. Hepatic cirrhosis and fibrosis.
5. 1.0 by 0.7 cm cystic lesion along the pancreatic head was not
well seen on prior exams. This could be a postinflammatory cyst or a
small intraductal papillary mucinous neoplasm. Given the size of the
lesion and the patient's age, hepatic or pancreatic protocol MRI is
recommended at least within 2 years time. This recommendation
follows ACR consensus guidelines: Management of Incidental
Pancreatic Cysts: A White Paper of the ACR Incidental Findings
Committee. [HOSPITAL] 1716;[DATE].
6. Uphill varices adjacent to the distal esophagus. Gastric and
splenic varices with splenorenal shunting.
7. Trace ascites.

## 2018-03-03 MED ORDER — GADOBUTROL 1 MMOL/ML IV SOLN
4.0000 mL | Freq: Once | INTRAVENOUS | Status: AC | PRN
  Administered 2018-03-03: 4 mL via INTRAVENOUS

## 2018-03-24 ENCOUNTER — Other Ambulatory Visit: Payer: Self-pay | Admitting: Family Medicine

## 2018-03-24 DIAGNOSIS — Z1231 Encounter for screening mammogram for malignant neoplasm of breast: Secondary | ICD-10-CM

## 2018-03-25 ENCOUNTER — Other Ambulatory Visit: Payer: Self-pay | Admitting: Family Medicine

## 2018-03-25 DIAGNOSIS — Z853 Personal history of malignant neoplasm of breast: Secondary | ICD-10-CM

## 2018-03-25 DIAGNOSIS — R2232 Localized swelling, mass and lump, left upper limb: Secondary | ICD-10-CM

## 2018-03-30 ENCOUNTER — Ambulatory Visit
Admission: RE | Admit: 2018-03-30 | Discharge: 2018-03-30 | Disposition: A | Discharge status: Home / Self Care | Payer: Medicare PPO | Source: Ambulatory Visit | Attending: Family Medicine | Admitting: Family Medicine

## 2018-03-30 DIAGNOSIS — Z1231 Encounter for screening mammogram for malignant neoplasm of breast: Secondary | ICD-10-CM | POA: Insufficient documentation

## 2018-03-30 DIAGNOSIS — Z853 Personal history of malignant neoplasm of breast: Secondary | ICD-10-CM

## 2018-03-30 DIAGNOSIS — R2232 Localized swelling, mass and lump, left upper limb: Secondary | ICD-10-CM | POA: Diagnosis present

## 2018-03-30 IMAGING — MG MM DIGITAL DIAGNOSTIC UNILAT*L* W/ TOMO W/ CAD
6 series · 6 of 18 positions shown · non-contrast
Comparison: Previous exam(s).

CLINICAL DATA: Left axillary area of palpable concern. History of
treated right breast cancer, status post mastectomy 3000.

EXAM:
DIGITAL DIAGNOSTIC LEFT MAMMOGRAM WITH CAD AND TOMO
ULTRASOUND LEFT BREAST

[L MLO synth-2D]
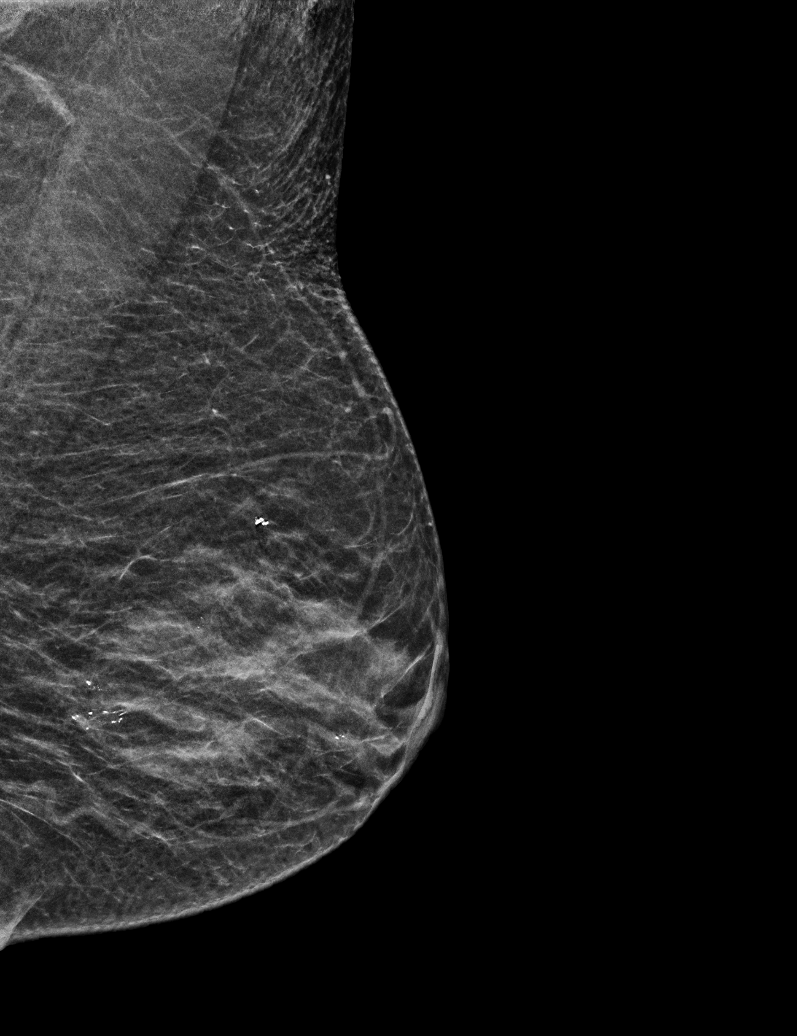

[L TAN synth-2D]
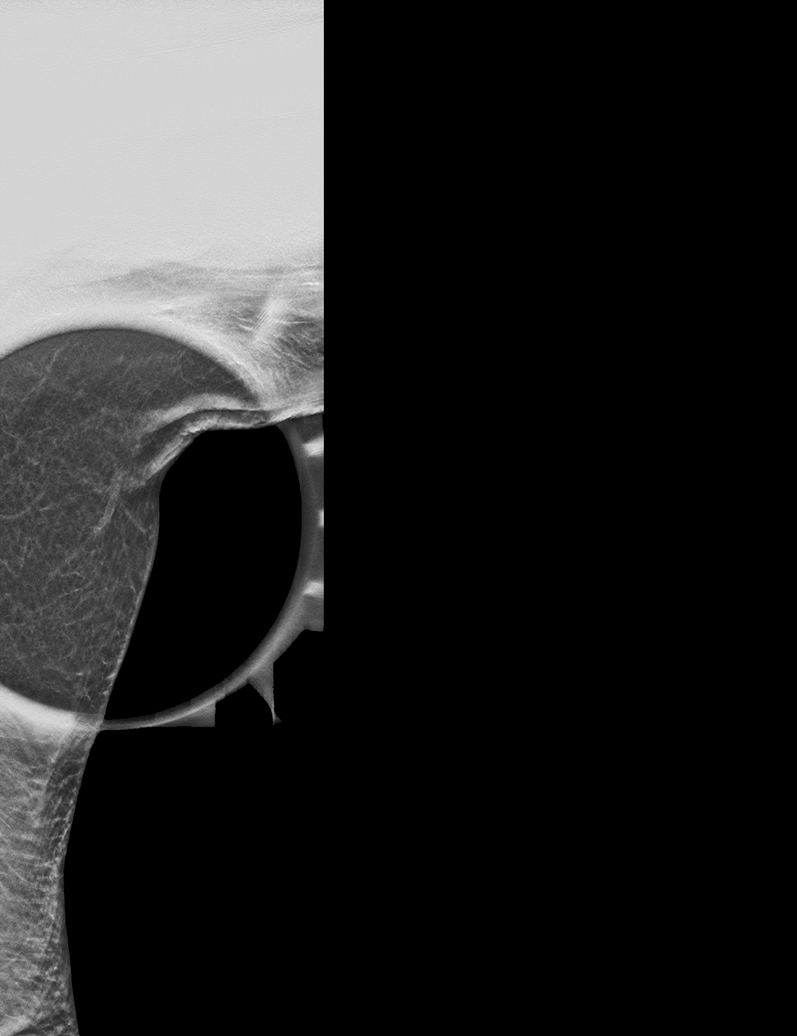

[L CC synth-2D]
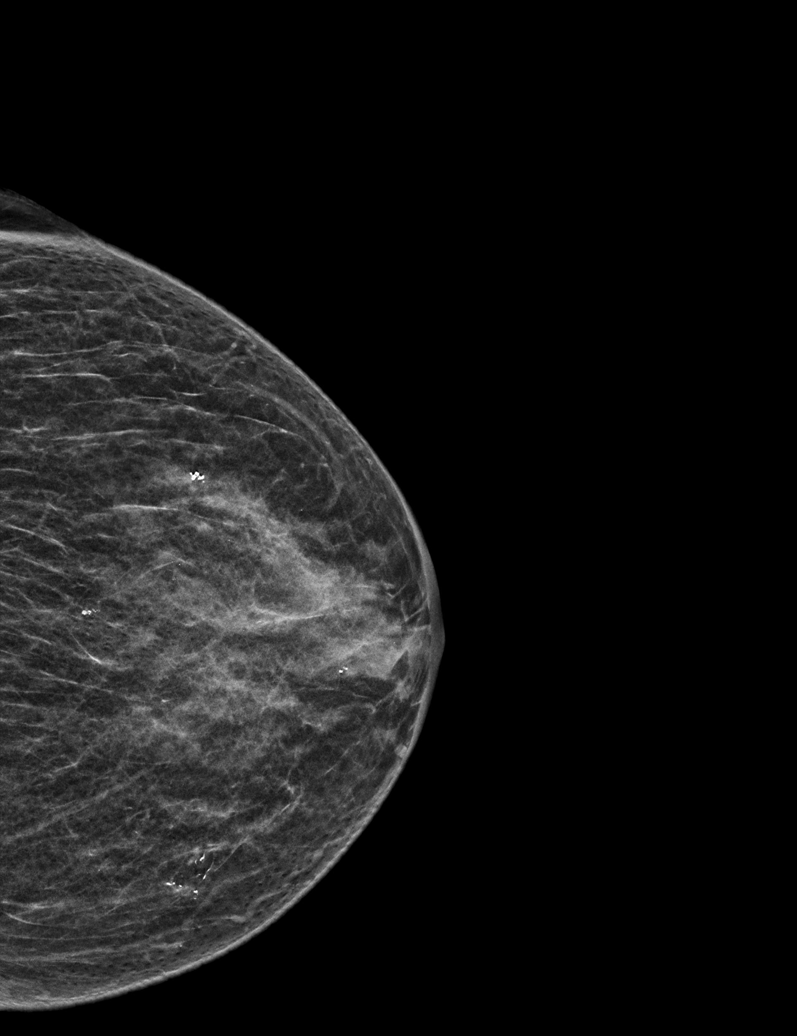

[L MLO tomo · tomo slice 19/37.0]
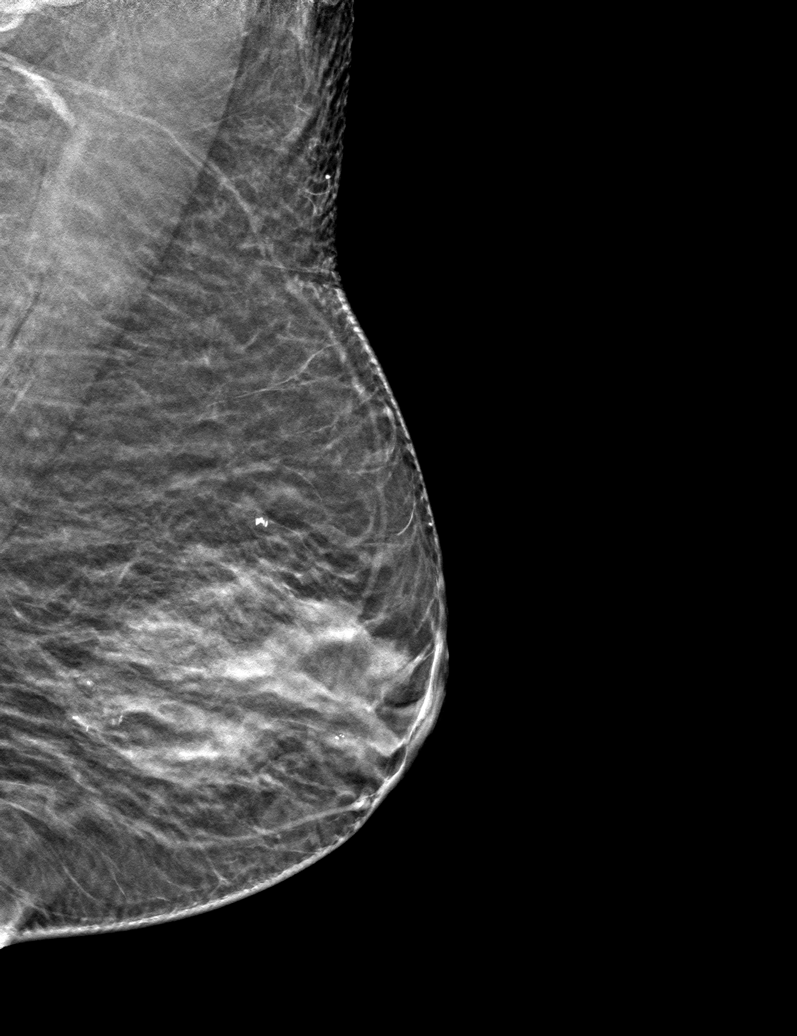

[L TAN tomo · tomo slice 15/28.0]
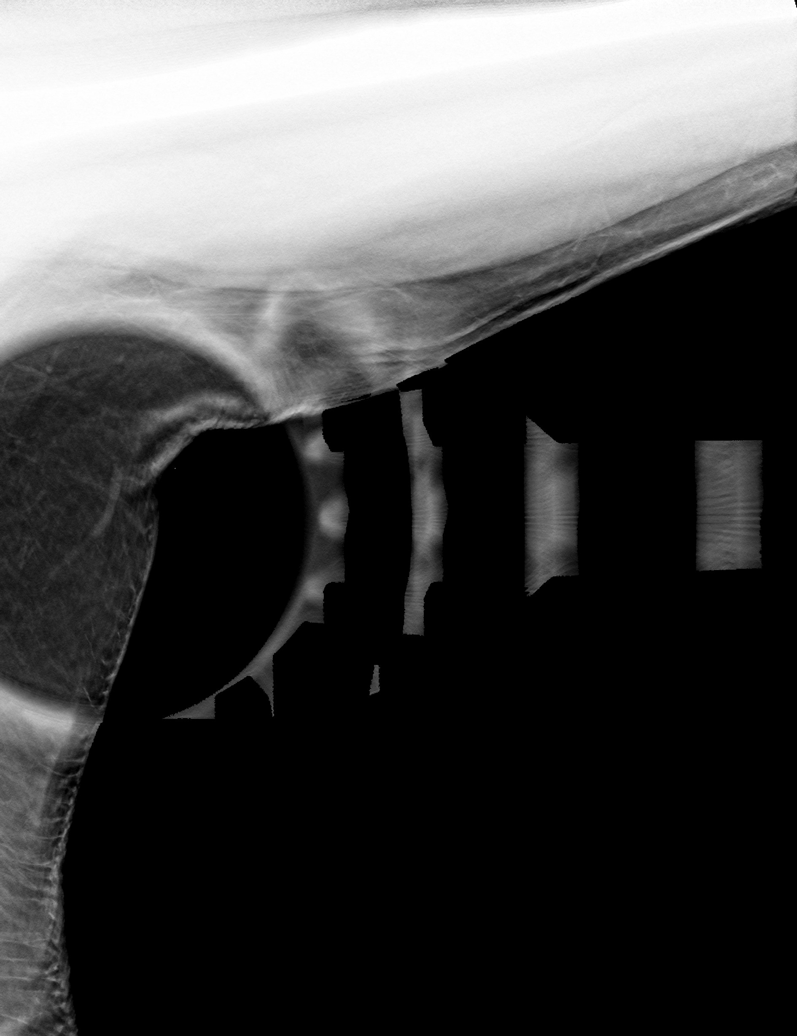

[L CC tomo · tomo slice 18/35.0]
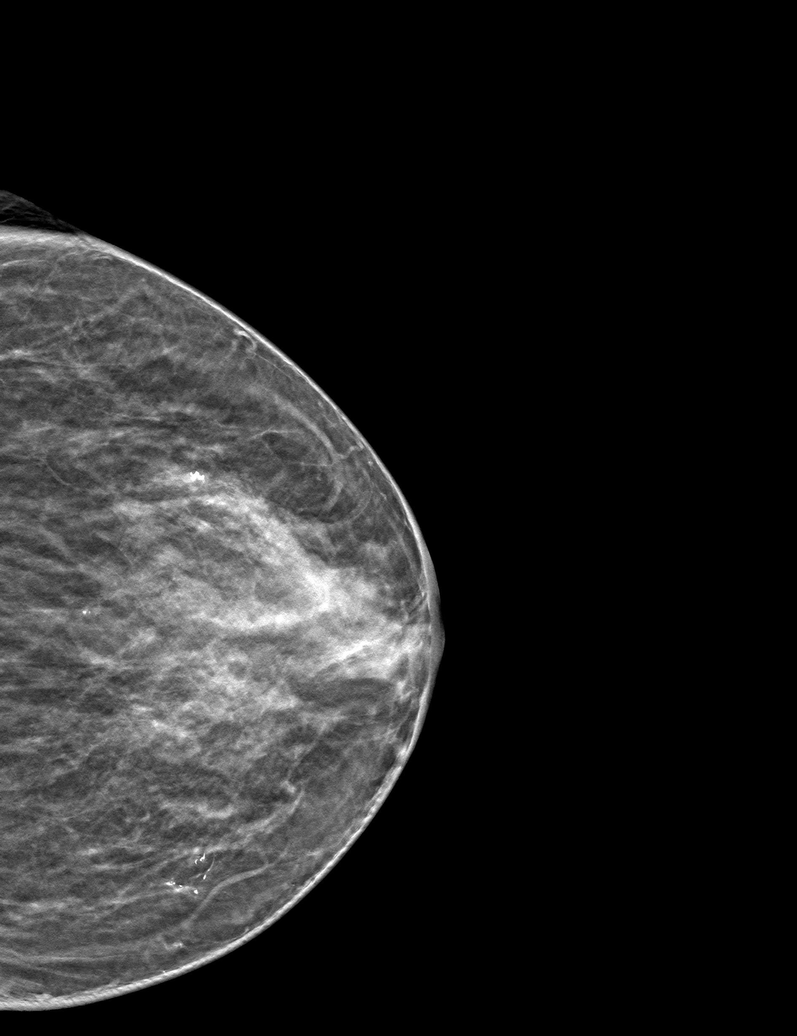

[6 of 18 positions shown; findings below may reference images not displayed]

ACR Breast Density Category c: The breast tissue is heterogeneously
dense, which may obscure small masses.
FINDINGS: Mammographically, there are no suspicious masses, areas of new
clustered calcifications or architectural distortion in the left
breast.

Mammographic images were processed with CAD.

On physical exam, no suspicious masses are palpated in the breast or
left axilla.

Targeted ultrasound is performed, showing no suspicious masses or
shadowing lesions in the left axilla. No evidence of left axillary
lymphadenopathy.
IMPRESSION: No mammographic sonographic evidence of malignancy in the left
breast.

RECOMMENDATION:
Further management of patient's left axillary area of palpable
concern should be based on clinical grounds.

Otherwise, screening left mammogram in one year.(Code:[id number]-[id number])

I have discussed the findings and recommendations with the patient.
Results were also provided in writing at the conclusion of the
visit. If applicable, a reminder letter will be sent to the patient
regarding the next appointment.

BI-RADS CATEGORY  2: Benign.

## 2018-03-30 IMAGING — US ULTRASOUND LEFT BREAST LIMITED
1 series · 4 of 4 positions shown · non-contrast
Comparison: Previous exam(s).

CLINICAL DATA: Left axillary area of palpable concern. History of
treated right breast cancer, status post mastectomy 3000.

EXAM:
DIGITAL DIAGNOSTIC LEFT MAMMOGRAM WITH CAD AND TOMO
ULTRASOUND LEFT BREAST

[Series 1: ultrasound left breast limited · 0.06mm/px · 4 of 4 slices shown]
[im 1/4]
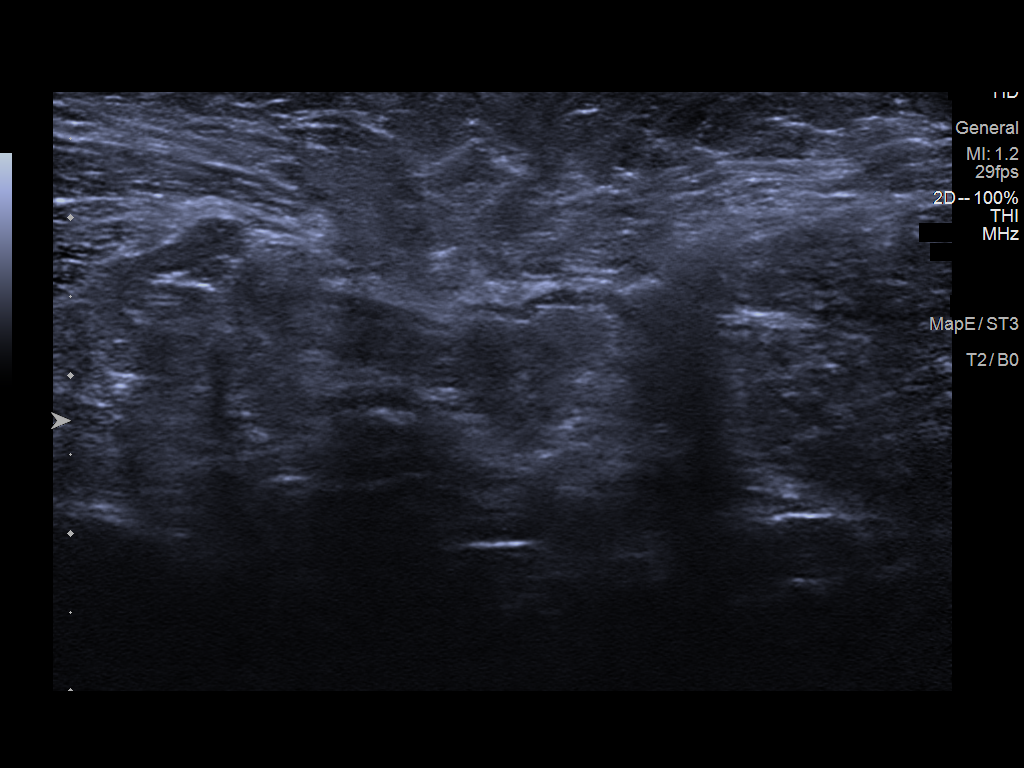
[im 2/4]
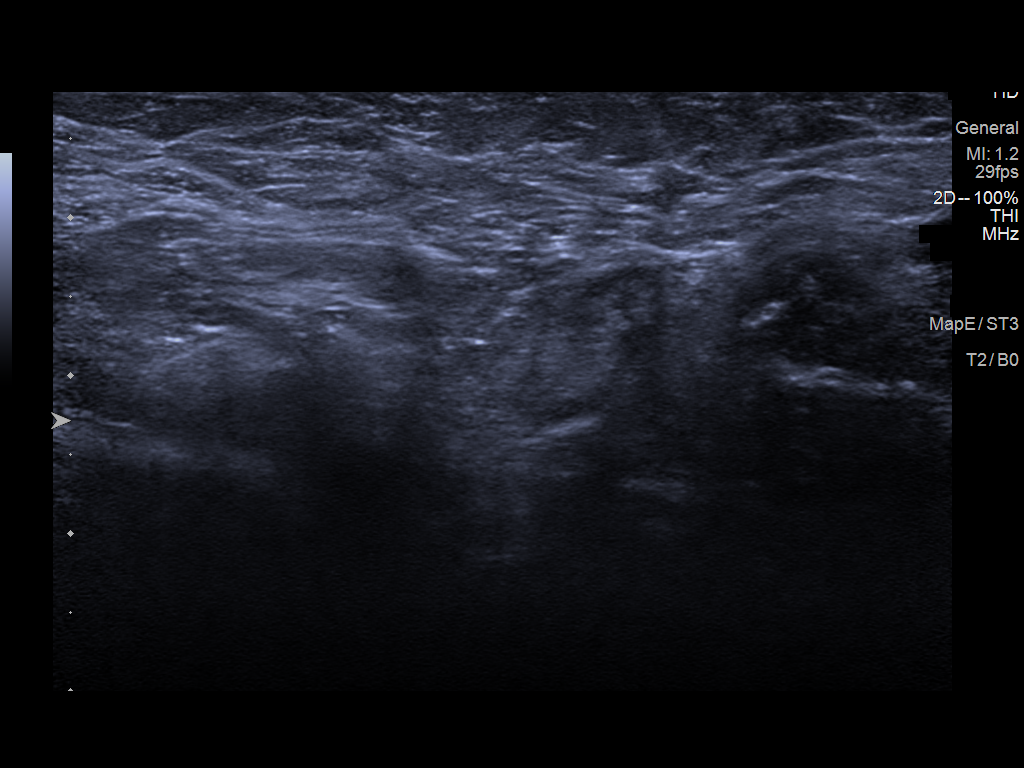
[im 3/4]
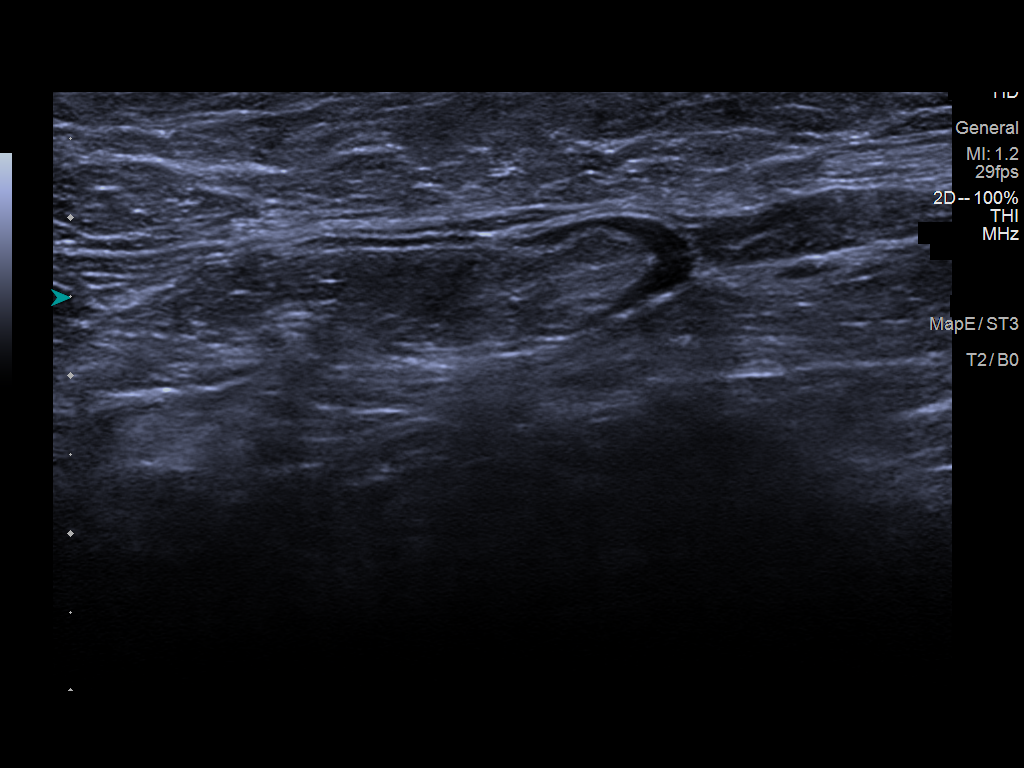
[im 4/4]
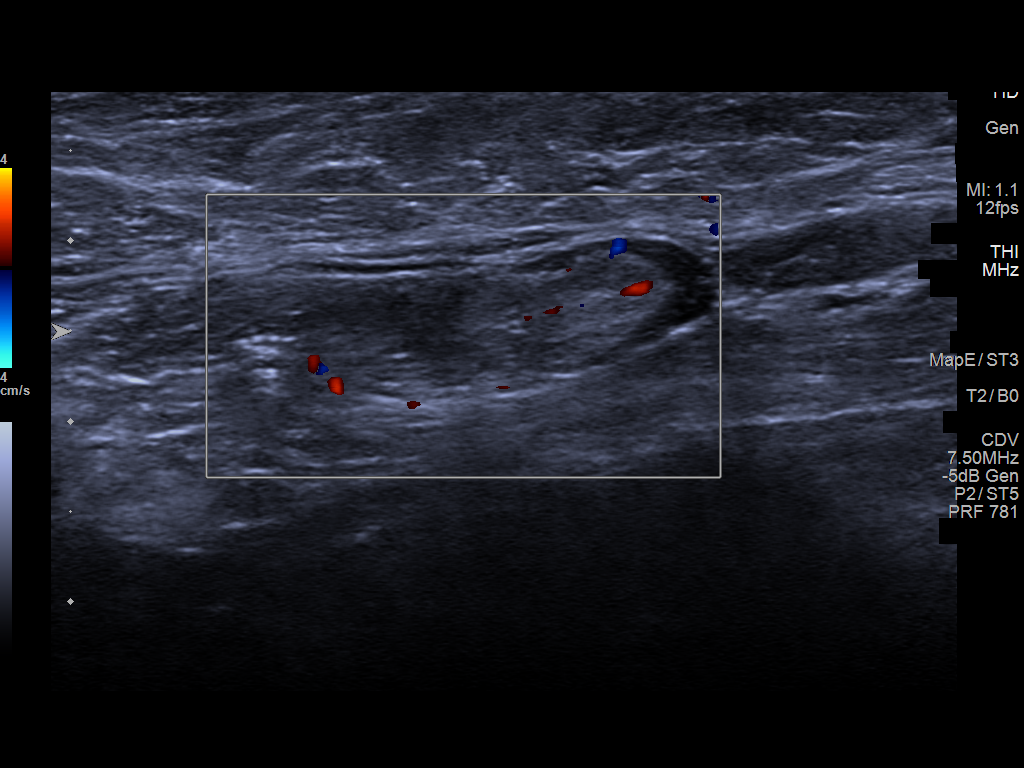

[4 of 4 positions shown; findings below may reference images not displayed]

ACR Breast Density Category c: The breast tissue is heterogeneously
dense, which may obscure small masses.
FINDINGS: Mammographically, there are no suspicious masses, areas of new
clustered calcifications or architectural distortion in the left
breast.

Mammographic images were processed with CAD.

On physical exam, no suspicious masses are palpated in the breast or
left axilla.

Targeted ultrasound is performed, showing no suspicious masses or
shadowing lesions in the left axilla. No evidence of left axillary
lymphadenopathy.
IMPRESSION: No mammographic sonographic evidence of malignancy in the left
breast.

RECOMMENDATION:
Further management of patient's left axillary area of palpable
concern should be based on clinical grounds.

Otherwise, screening left mammogram in one year.(Code:[id number])

I have discussed the findings and recommendations with the patient.
Results were also provided in writing at the conclusion of the
visit. If applicable, a reminder letter will be sent to the patient
regarding the next appointment.

BI-RADS CATEGORY  2: Benign.

## 2018-05-13 ENCOUNTER — Inpatient Hospital Stay: Payer: Medicare PPO

## 2018-08-12 ENCOUNTER — Ambulatory Visit: Payer: Medicare PPO | Admitting: Hematology and Oncology

## 2018-08-12 ENCOUNTER — Other Ambulatory Visit: Payer: Medicare PPO

## 2019-08-11 ENCOUNTER — Other Ambulatory Visit: Payer: Self-pay | Admitting: Family Medicine

## 2019-08-11 DIAGNOSIS — Z1231 Encounter for screening mammogram for malignant neoplasm of breast: Secondary | ICD-10-CM

## 2019-10-12 ENCOUNTER — Ambulatory Visit
Admission: RE | Admit: 2019-10-12 | Discharge: 2019-10-12 | Disposition: A | Discharge status: Home / Self Care | Payer: Medicare PPO | Source: Ambulatory Visit | Attending: Family Medicine | Admitting: Family Medicine

## 2019-10-12 ENCOUNTER — Other Ambulatory Visit: Payer: Self-pay

## 2019-10-12 DIAGNOSIS — Z1231 Encounter for screening mammogram for malignant neoplasm of breast: Secondary | ICD-10-CM | POA: Insufficient documentation

## 2019-10-12 HISTORY — DX: Malignant neoplasm of unspecified site of unspecified female breast: C50.919

## 2019-10-12 IMAGING — MG DIGITAL SCREENING UNILAT LEFT W/ TOMO W/ CAD
4 series · 4 of 12 positions shown · non-contrast
Comparison: Previous exam(s).

CLINICAL DATA: Screening. RIGHT mastectomy in 4999

EXAM:
DIGITAL SCREENING UNILATERAL LEFT MAMMOGRAM WITH CAD AND TOMO

[L CC synth-2D]
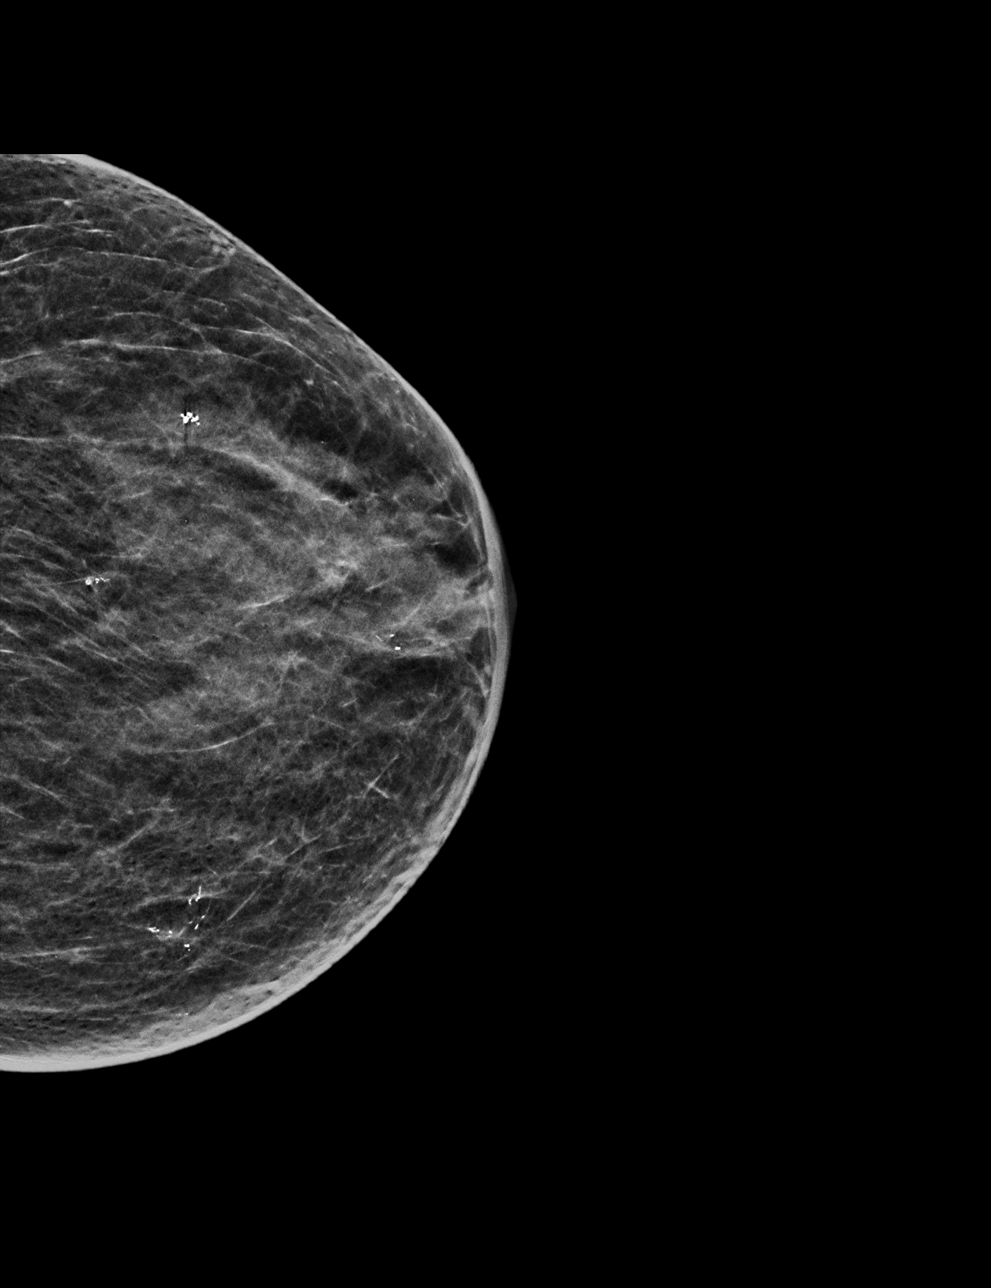

[L MLO synth-2D]
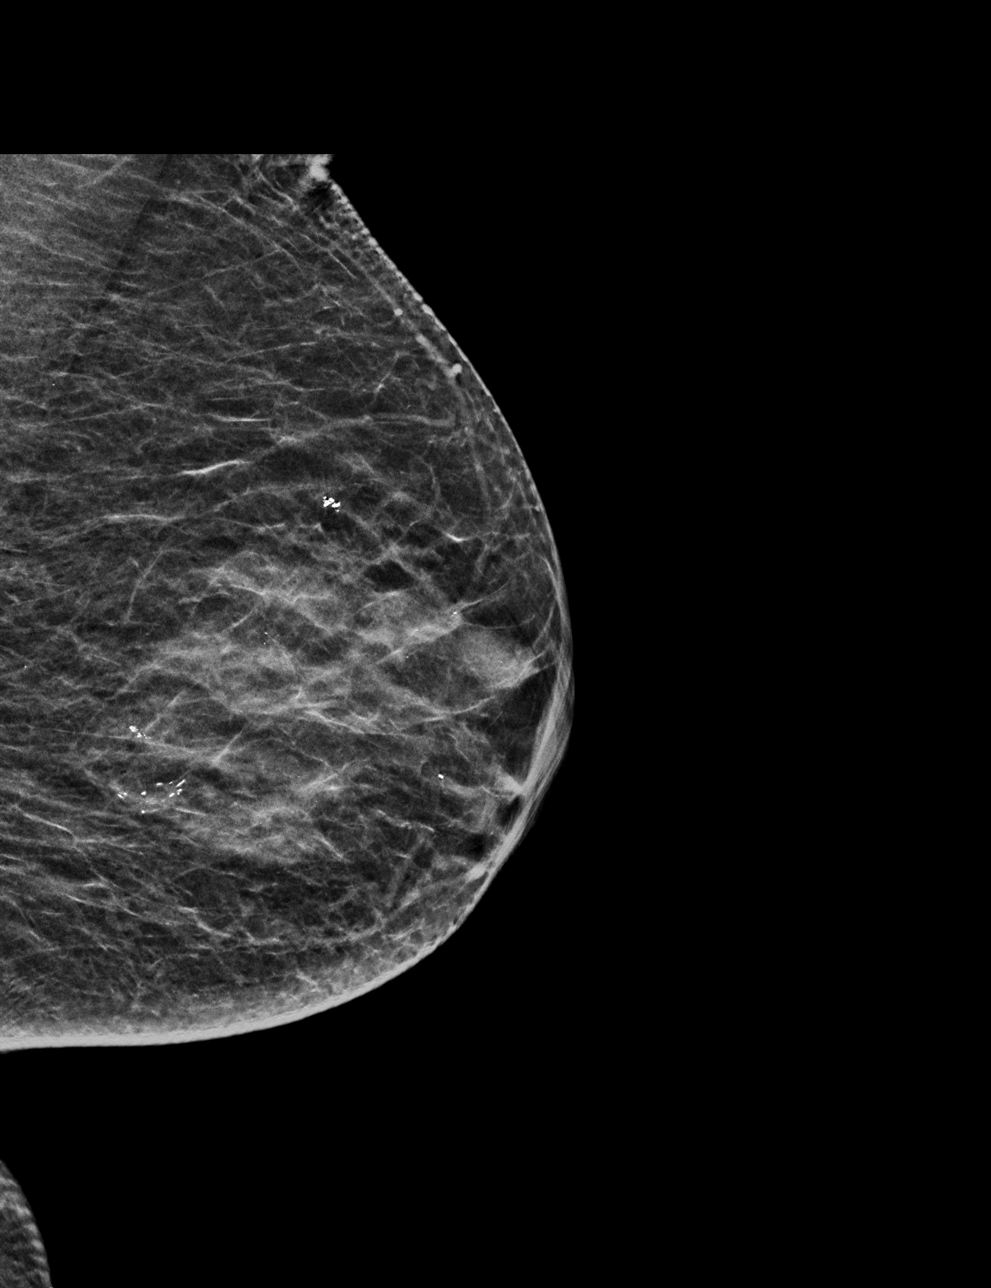

[L MLO tomo · tomo slice 20/39.0]
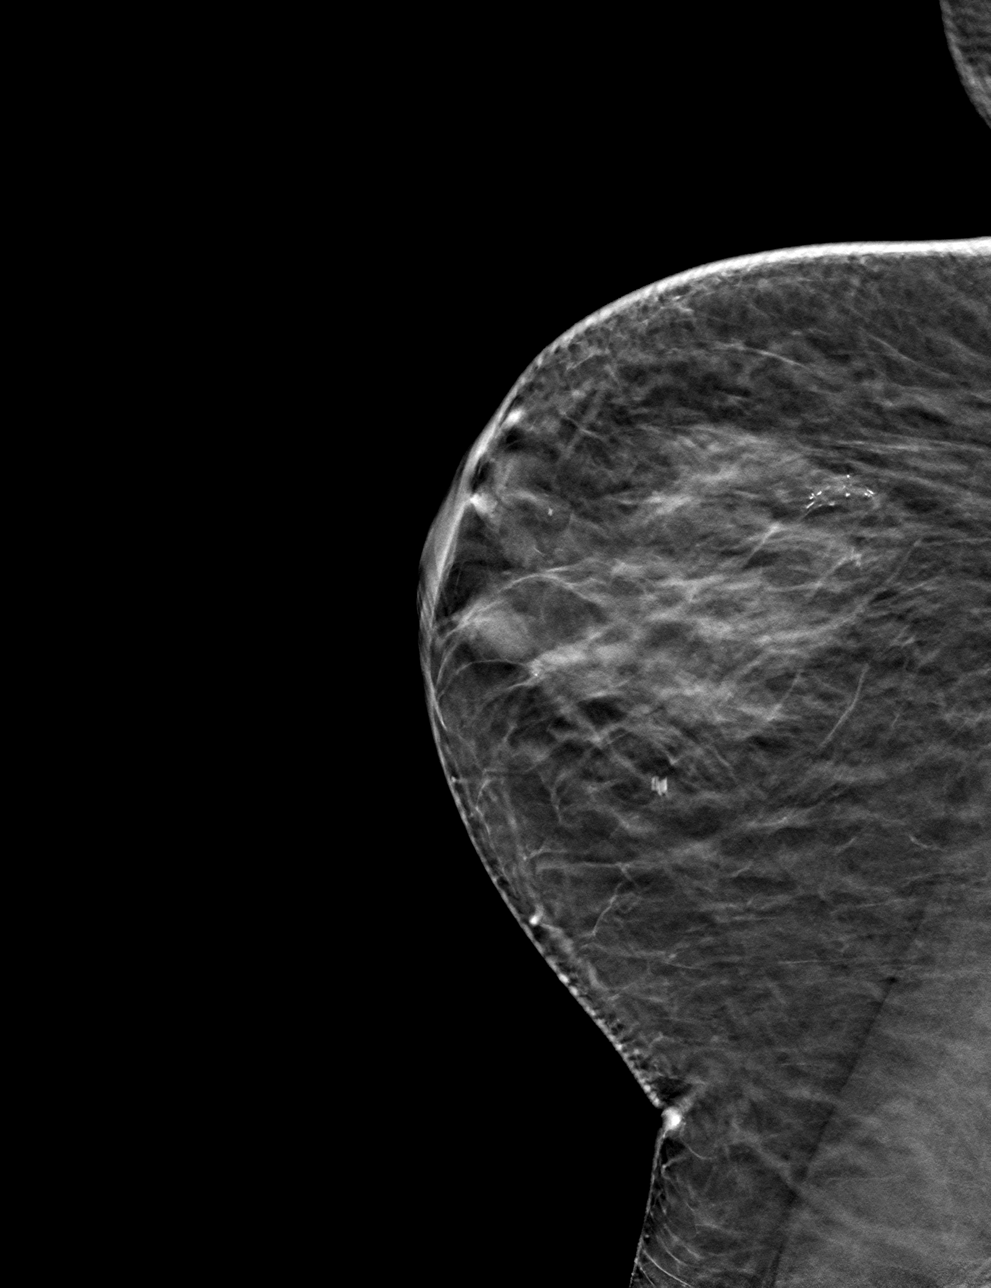

[L CC tomo · tomo slice 19/38.0]
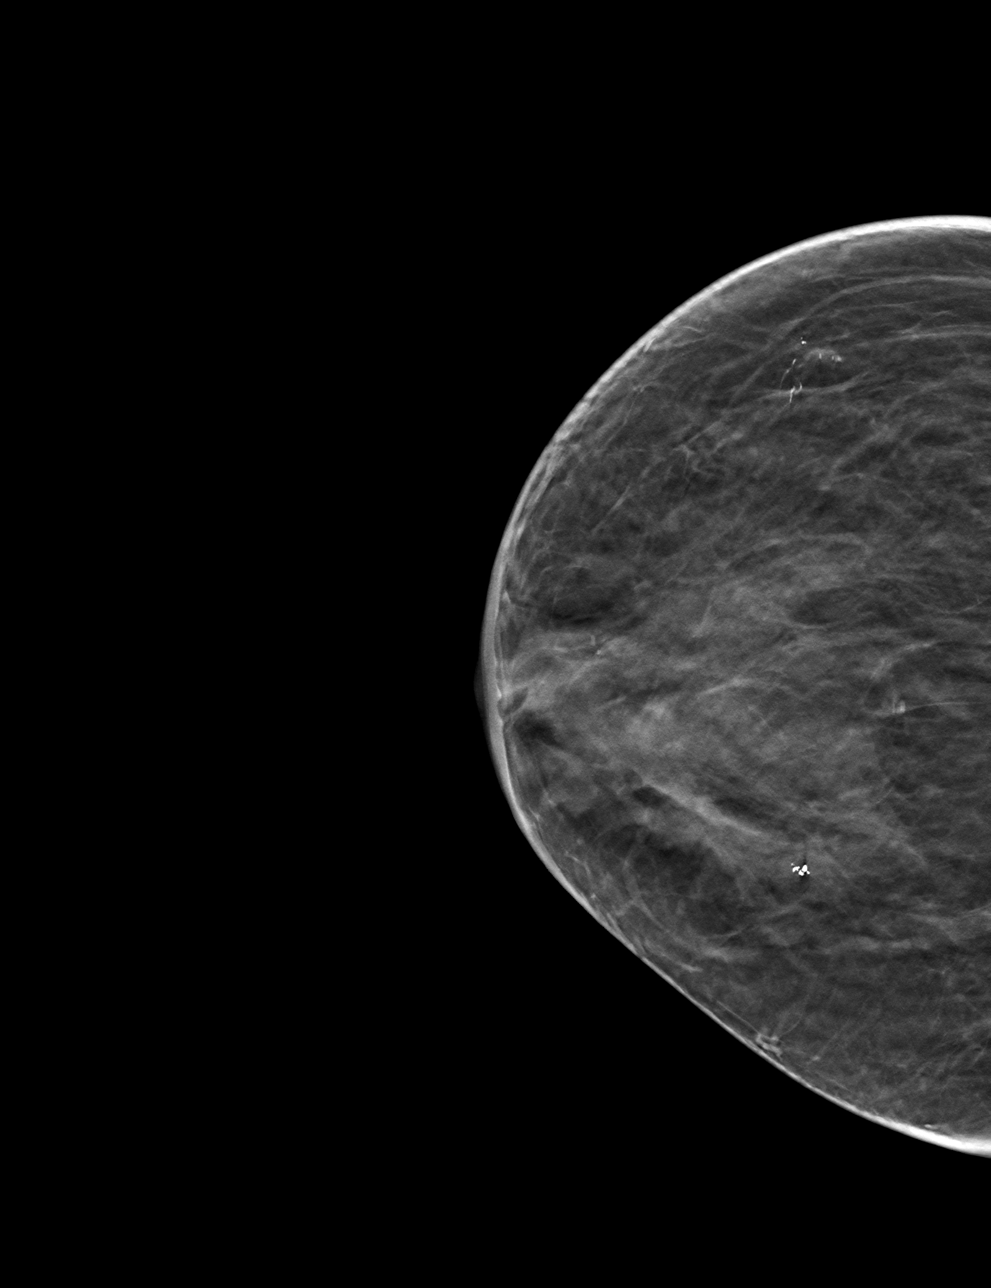

[4 of 12 positions shown; findings below may reference images not displayed]

ACR Breast Density Category c: The breast tissue is heterogeneously
dense, which may obscure small masses.
FINDINGS: There are no findings suspicious for malignancy. Status post RIGHT
mastectomy. Images were processed with CAD.
IMPRESSION: No mammographic evidence of malignancy. A result letter of this
screening mammogram will be mailed directly to the patient.

RECOMMENDATION:
Screening mammogram in one year. (Code:[id number])

BI-RADS CATEGORY  1: Negative.

## 2020-10-28 DEATH — deceased
